# Patient Record
Sex: Male | Born: 1960
Health system: Southern US, Community
[De-identification: ages and names within clinical notes are randomized; demographics above are authoritative.]

## PROBLEM LIST (undated history)

## (undated) DIAGNOSIS — F419 Anxiety disorder, unspecified: Secondary | ICD-10-CM

## (undated) DIAGNOSIS — E291 Testicular hypofunction: Secondary | ICD-10-CM

## (undated) DIAGNOSIS — I10 Essential (primary) hypertension: Secondary | ICD-10-CM

## (undated) DIAGNOSIS — E785 Hyperlipidemia, unspecified: Secondary | ICD-10-CM

## (undated) HISTORY — DX: Testicular hypofunction: E29.1

## (undated) HISTORY — DX: Essential (primary) hypertension: I10

## (undated) HISTORY — DX: Hyperlipidemia, unspecified: E78.5

## (undated) HISTORY — DX: Anxiety disorder, unspecified: F41.9

---

## 2005-02-15 DIAGNOSIS — E291 Testicular hypofunction: Secondary | ICD-10-CM

## 2005-02-15 HISTORY — DX: Testicular hypofunction: E29.1

## 2013-07-11 LAB — BASIC METABOLIC PANEL
BUN: 19 mg/dL (ref 4–21)
CREATININE: 1.1 mg/dL (ref 0.6–1.3)
Glucose: 103 mg/dL
POTASSIUM: 4.5 mmol/L (ref 3.4–5.3)
Sodium: 141 mmol/L (ref 137–147)

## 2013-07-11 LAB — HEPATIC FUNCTION PANEL
ALK PHOS: 66 U/L (ref 25–125)
ALT: 50 U/L — AB (ref 10–40)
AST: 26 U/L (ref 14–40)
BILIRUBIN, TOTAL: 0.3 mg/dL

## 2013-07-11 LAB — LIPID PANEL
Cholesterol: 214 mg/dL — AB (ref 0–200)
HDL: 35 mg/dL (ref 35–70)
LDL Cholesterol: 130 mg/dL
TRIGLYCERIDES: 245 mg/dL — AB (ref 40–160)

## 2013-11-05 ENCOUNTER — Ambulatory Visit: Payer: Self-pay | Admitting: Internal Medicine

## 2013-11-19 ENCOUNTER — Ambulatory Visit: Payer: Self-pay | Admitting: Internal Medicine

## 2013-11-21 ENCOUNTER — Other Ambulatory Visit (INDEPENDENT_AMBULATORY_CARE_PROVIDER_SITE_OTHER): Payer: Commercial Managed Care - PPO

## 2013-11-21 ENCOUNTER — Encounter: Payer: Self-pay | Admitting: Internal Medicine

## 2013-11-21 ENCOUNTER — Ambulatory Visit (INDEPENDENT_AMBULATORY_CARE_PROVIDER_SITE_OTHER): Payer: Commercial Managed Care - PPO | Admitting: Internal Medicine

## 2013-11-21 VITALS — BP 122/84 | HR 68 | Temp 97.8°F | Resp 16 | Ht 71.0 in | Wt 202.0 lb

## 2013-11-21 DIAGNOSIS — F5105 Insomnia due to other mental disorder: Secondary | ICD-10-CM

## 2013-11-21 DIAGNOSIS — R5382 Chronic fatigue, unspecified: Secondary | ICD-10-CM

## 2013-11-21 DIAGNOSIS — Z0000 Encounter for general adult medical examination without abnormal findings: Secondary | ICD-10-CM | POA: Insufficient documentation

## 2013-11-21 DIAGNOSIS — R739 Hyperglycemia, unspecified: Secondary | ICD-10-CM | POA: Insufficient documentation

## 2013-11-21 DIAGNOSIS — F419 Anxiety disorder, unspecified: Secondary | ICD-10-CM

## 2013-11-21 DIAGNOSIS — E291 Testicular hypofunction: Secondary | ICD-10-CM

## 2013-11-21 LAB — FECAL OCCULT BLOOD, GUAIAC: Fecal Occult Blood: NEGATIVE

## 2013-11-21 LAB — LUTEINIZING HORMONE: LH: 5.55 m[IU]/mL (ref 1.50–9.30)

## 2013-11-21 LAB — CBC WITH DIFFERENTIAL/PLATELET
Basophils Absolute: 0 10*3/uL (ref 0.0–0.1)
Basophils Relative: 0.4 % (ref 0.0–3.0)
EOS ABS: 0.1 10*3/uL (ref 0.0–0.7)
Eosinophils Relative: 1.8 % (ref 0.0–5.0)
HEMATOCRIT: 45.1 % (ref 39.0–52.0)
HEMOGLOBIN: 15.4 g/dL (ref 13.0–17.0)
Lymphocytes Relative: 51 % — ABNORMAL HIGH (ref 12.0–46.0)
Lymphs Abs: 3.2 10*3/uL (ref 0.7–4.0)
MCHC: 34 g/dL (ref 30.0–36.0)
MCV: 94.9 fl (ref 78.0–100.0)
MONO ABS: 0.7 10*3/uL (ref 0.1–1.0)
Monocytes Relative: 11.1 % (ref 3.0–12.0)
NEUTROS ABS: 2.2 10*3/uL (ref 1.4–7.7)
Neutrophils Relative %: 35.7 % — ABNORMAL LOW (ref 43.0–77.0)
Platelets: 255 10*3/uL (ref 150.0–400.0)
RBC: 4.75 Mil/uL (ref 4.22–5.81)
RDW: 12.6 % (ref 11.5–15.5)
WBC: 6.3 10*3/uL (ref 4.0–10.5)

## 2013-11-21 LAB — COMPREHENSIVE METABOLIC PANEL
ALK PHOS: 64 U/L (ref 39–117)
ALT: 66 U/L — ABNORMAL HIGH (ref 0–53)
AST: 34 U/L (ref 0–37)
Albumin: 4.1 g/dL (ref 3.5–5.2)
BUN: 20 mg/dL (ref 6–23)
CALCIUM: 9.6 mg/dL (ref 8.4–10.5)
CO2: 26 mEq/L (ref 19–32)
Chloride: 105 mEq/L (ref 96–112)
Creatinine, Ser: 1 mg/dL (ref 0.4–1.5)
GFR: 81.47 mL/min (ref >60.00)
GLUCOSE: 97 mg/dL (ref 70–99)
Potassium: 4.7 mEq/L (ref 3.5–5.1)
Sodium: 138 mEq/L (ref 135–145)
Total Bilirubin: 0.7 mg/dL (ref 0.2–1.2)
Total Protein: 7.4 g/dL (ref 6.0–8.3)

## 2013-11-21 LAB — FOLLICLE STIMULATING HORMONE: FSH: 13.4 m[IU]/mL (ref 1.4–18.1)

## 2013-11-21 LAB — HEMOGLOBIN A1C: Hgb A1c MFr Bld: 5.5 % (ref 4.6–6.5)

## 2013-11-21 LAB — PSA: PSA: 1.29 ng/mL (ref 0.10–4.00)

## 2013-11-21 LAB — PROLACTIN: PROLACTIN: 6.1 ng/mL (ref 2.1–17.1)

## 2013-11-21 LAB — TSH: TSH: 1.46 u[IU]/mL (ref 0.35–4.50)

## 2013-11-21 MED ORDER — "NEEDLE (DISP) 18G X 1-1/2"" MISC": Status: DC

## 2013-11-21 MED ORDER — SUVOREXANT 15 MG PO TABS: 1.0000 | ORAL_TABLET | Freq: Every evening | ORAL | Status: DC | PRN

## 2013-11-21 MED ORDER — "SYRINGE/NEEDLE (DISP) 23G X 1"" 3 ML MISC": Status: DC

## 2013-11-21 MED ORDER — TESTOSTERONE CYPIONATE 200 MG/ML IM SOLN: 200.0000 mg | INTRAMUSCULAR | Status: DC

## 2013-11-21 NOTE — Assessment & Plan Note (Signed)
Will restart testosterone replacement therapy Will check his T level today as well as prolactin, FSH, LH, PSA, and TSH to look for secondary causes of hypogonadism

## 2013-11-21 NOTE — Assessment & Plan Note (Signed)
I will check labs to look for secondary of fatigue Will treat the insomnia and start TRT

## 2013-11-21 NOTE — Assessment & Plan Note (Signed)
Exam done Vaccines were updated and reviewed Labs ordered He was referred for a colonoscopy Pt ed material was given

## 2013-11-21 NOTE — Progress Notes (Signed)
Subjective:    Patient ID: Taylor Powell, male    DOB: Feb 21, 1960, 53 y.o.   MRN: [id number]  HPI Comments: New to me he complains of a 10 month history of worsening fatigue, low libido, weight gain, and ED. He also has chronic insomnia with DFA and FA, some nights he only sleeps for 2-3 hours.     Review of Systems  Constitutional: Positive for fatigue and unexpected weight change. Negative for fever, chills, diaphoresis, activity change and appetite change.  HENT: Negative.   Eyes: Negative.   Respiratory: Negative.  Negative for apnea, cough, choking, chest tightness, shortness of breath, wheezing and stridor.   Cardiovascular: Negative.  Negative for chest pain, palpitations and leg swelling.  Gastrointestinal: Negative.  Negative for nausea, vomiting, abdominal pain, diarrhea, constipation, blood in stool, abdominal distention, anal bleeding and rectal pain.  Endocrine: Negative.   Genitourinary: Negative.   Musculoskeletal: Positive for back pain (chronic, unchanged recently). Negative for arthralgias, gait problem, joint swelling, myalgias, neck pain and neck stiffness.  Skin: Negative.  Negative for rash.  Allergic/Immunologic: Negative.   Neurological: Negative.  Negative for dizziness, tremors, seizures, syncope, facial asymmetry, speech difficulty, weakness, light-headedness, numbness and headaches.  Hematological: Negative.  Negative for adenopathy. Does not bruise/bleed easily.  Psychiatric/Behavioral: Positive for sleep disturbance. Negative for suicidal ideas, hallucinations, behavioral problems, confusion, self-injury, dysphoric mood, decreased concentration and agitation. The patient is nervous/anxious. The patient is not hyperactive.        Objective:   Physical Exam  Vitals reviewed. Constitutional: He is oriented to person, place, and time. He appears well-developed and well-nourished. No distress.  HENT:  Head: Normocephalic and atraumatic.  Mouth/Throat:  Oropharynx is clear and moist. No oropharyngeal exudate.  Eyes: Conjunctivae are normal. Right eye exhibits no discharge. Left eye exhibits no discharge. No scleral icterus.  Neck: Normal range of motion. Neck supple. No JVD present. No tracheal deviation present. No thyromegaly present.  Cardiovascular: Normal rate, regular rhythm, normal heart sounds and intact distal pulses.  Exam reveals no gallop and no friction rub.   No murmur heard. Pulmonary/Chest: Effort normal and breath sounds normal. No stridor. No respiratory distress. He has no wheezes. He has no rales. He exhibits no tenderness.  Abdominal: Soft. Bowel sounds are normal. He exhibits no distension and no mass. There is no tenderness. There is no rebound and no guarding. Hernia confirmed negative in the right inguinal area and confirmed negative in the left inguinal area.  Genitourinary: Rectum normal, prostate normal, testes normal and penis normal. Rectal exam shows no external hemorrhoid, no internal hemorrhoid, no fissure, no mass, no tenderness and anal tone normal. Guaiac negative stool. Prostate is not enlarged and not tender. Right testis shows no mass, no swelling and no tenderness. Right testis is descended. Left testis shows no mass, no swelling and no tenderness. Left testis is descended. Circumcised. No penile erythema or penile tenderness. No discharge found.  Musculoskeletal: Normal range of motion. He exhibits no edema and no tenderness.  Lymphadenopathy:    He has no cervical adenopathy.       Right: No inguinal adenopathy present.       Left: No inguinal adenopathy present.  Neurological: He is oriented to person, place, and time.  Skin: Skin is warm and dry. No rash noted. He is not diaphoretic. No erythema. No pallor.  Psychiatric: He has a normal mood and affect. His behavior is normal. Judgment and thought content normal.     Lab Results  Component Value Date   CHOL 214* 07/11/2013   TRIG 245* 07/11/2013   HDL  35 07/11/2013   LDLCALC 130 07/11/2013   ALT 50* 07/11/2013   AST 26 07/11/2013   NA 141 07/11/2013   K 4.5 07/11/2013   CREATININE 1.1 07/11/2013   BUN 19 07/11/2013       Assessment & Plan:

## 2013-11-21 NOTE — Patient Instructions (Signed)
Testosterone This test is used to determine if your testosterone level is abnormal. This could be used to explain difficulty getting an erection (erectile dysfunction), inability of your partner to get pregnant (infertility), premature or delayed puberty if you are male, or the appearance of masculine physical features if you are male. PREPARATION FOR TEST A blood sample is obtained by inserting a needle into a vein in the arm. NORMAL FINDINGS  Free Testosterone: 0.3-2 pg/mL  % Free Testosterone: 0.1%-0.3% Total Testosterone:  7 mos-9 yrs (Tanner Stage I)  Male: Less than 30 ng/dL  Male: Less than 30 ng/dL  10-13 yrs (Tanner Stage II)  Male: Less than 300 ng/dL  Male: Less than 40 ng/dL  14-15 yrs (Tanner Stage III)  Male: 170-540 ng/dL  Male: Less than 60 ng/dL  16-19 yrs (Tanner Stage IV, V)  Male: 250-910 ng/dL  Male: Less than 70 ng/dL  20 yrs and over  Male: 280-1080 ng/dL  Male: Less than 70 ng/dL Ranges for normal findings may vary among different laboratories and hospitals. You should always check with your doctor after having lab work or other tests done to discuss the meaning of your test results and whether your values are considered within normal limits. MEANING OF TEST  Your caregiver will go over the test results with you and discuss the importance and meaning of your results, as well as treatment options and the need for additional tests if necessary. OBTAINING THE TEST RESULTS It is your responsibility to obtain your test results. Ask the lab or department performing the test when and how you will get your results. Document Released: 02/19/2004 Document Revised: 04/26/2011 Document Reviewed: 05/30/2013 [employer] Patient Information 2015 [employer]. This information is not intended to replace advice given to you by your health care provider. Make sure you discuss any questions you have with your health care provider.  

## 2013-11-21 NOTE — Assessment & Plan Note (Signed)
He will try belsomra for this 

## 2013-11-21 NOTE — Assessment & Plan Note (Signed)
Will check his A1C to see if he has developed DM2 

## 2013-11-22 LAB — TESTOSTERONE, FREE, TOTAL, SHBG
Sex Hormone Binding: 18 nmol/L (ref 13–71)
TESTOSTERONE: 201 ng/dL — AB (ref 300–890)
Testosterone, Free: 51.9 pg/mL (ref 47.0–244.0)
Testosterone-% Free: 2.6 % (ref 1.6–2.9)

## 2013-11-26 ENCOUNTER — Ambulatory Visit: Payer: Self-pay | Admitting: Internal Medicine

## 2013-11-30 ENCOUNTER — Telehealth: Payer: Self-pay | Admitting: Internal Medicine

## 2013-11-30 NOTE — Telephone Encounter (Signed)
Pt called and is requesting results from all lab results, he rec'd testosterone results but not the rest. He also rec'd a call from a Dr Fortunato CurlingJung office for appt. That office informed pt Dr Yetta BarreJones referred pt. Pt wants clarification why this appt was made as he was not aware.

## 2013-11-30 NOTE — Telephone Encounter (Signed)
Pt notified//lmovm 

## 2013-12-04 ENCOUNTER — Telehealth: Payer: Self-pay | Admitting: Internal Medicine

## 2013-12-04 DIAGNOSIS — Z0000 Encounter for general adult medical examination without abnormal findings: Secondary | ICD-10-CM

## 2013-12-04 NOTE — Telephone Encounter (Signed)
Patient left msg on my vm stating he did not get his questions answered regarding his lab work. CB# [phone]

## 2013-12-05 NOTE — Telephone Encounter (Signed)
done

## 2013-12-05 NOTE — Telephone Encounter (Signed)
Called spoke with patient who is upset over lab orders. He states that he requested labs for a lipid panel, HIV testing, and any other general labs for CPX that may have been left off. Pt would like those orders placed so that he may come back by when available. Thanks

## 2014-05-21 ENCOUNTER — Telehealth: Payer: Self-pay | Admitting: Internal Medicine

## 2014-05-21 NOTE — Telephone Encounter (Signed)
It has been six months since his last visit and this is a narcotic He will have to be seen

## 2014-05-21 NOTE — Telephone Encounter (Signed)
Pt called in and is need refill on testosterone cypionate (DEPOTESTOTERONE CYPIONATE) 200 MG/ML injection [[id number]][[id number]]  And also wants to know if he is suppose to come in for more blood work.  He stated he was suppose to get full blood work up on last visit   Best number [phone]

## 2014-05-22 NOTE — Telephone Encounter (Signed)
Pt back in and he is frustrated.  He is completely out of testosterone.  He made an appt for next wed the 13th but he needs enough testosterone to make til next week?  He would like a call back today    Best number [phone]. Ok to leave a message if he doesn't answer

## 2014-05-27 ENCOUNTER — Ambulatory Visit: Payer: Commercial Managed Care - PPO | Admitting: Internal Medicine

## 2014-05-29 ENCOUNTER — Other Ambulatory Visit (INDEPENDENT_AMBULATORY_CARE_PROVIDER_SITE_OTHER): Payer: Commercial Managed Care - PPO

## 2014-05-29 ENCOUNTER — Encounter: Payer: Self-pay | Admitting: Internal Medicine

## 2014-05-29 ENCOUNTER — Ambulatory Visit (INDEPENDENT_AMBULATORY_CARE_PROVIDER_SITE_OTHER): Payer: Commercial Managed Care - PPO | Admitting: Internal Medicine

## 2014-05-29 VITALS — BP 144/98 | HR 84 | Temp 98.7°F | Resp 16 | Ht 71.0 in | Wt 205.0 lb

## 2014-05-29 DIAGNOSIS — Z0000 Encounter for general adult medical examination without abnormal findings: Secondary | ICD-10-CM

## 2014-05-29 DIAGNOSIS — E291 Testicular hypofunction: Secondary | ICD-10-CM

## 2014-05-29 DIAGNOSIS — R7989 Other specified abnormal findings of blood chemistry: Secondary | ICD-10-CM | POA: Diagnosis not present

## 2014-05-29 DIAGNOSIS — I10 Essential (primary) hypertension: Secondary | ICD-10-CM

## 2014-05-29 LAB — CBC WITH DIFFERENTIAL/PLATELET
BASOS PCT: 0.3 % (ref 0.0–3.0)
Basophils Absolute: 0 10*3/uL (ref 0.0–0.1)
EOS PCT: 1.8 % (ref 0.0–5.0)
Eosinophils Absolute: 0.1 10*3/uL (ref 0.0–0.7)
HCT: 53 % — ABNORMAL HIGH (ref 39.0–52.0)
HEMOGLOBIN: 18 g/dL — AB (ref 13.0–17.0)
LYMPHS PCT: 37.3 % (ref 12.0–46.0)
Lymphs Abs: 2.7 10*3/uL (ref 0.7–4.0)
MCHC: 33.9 g/dL (ref 30.0–36.0)
MCV: 93.6 fl (ref 78.0–100.0)
Monocytes Absolute: 0.7 10*3/uL (ref 0.1–1.0)
Monocytes Relative: 9.5 % (ref 3.0–12.0)
Neutro Abs: 3.7 10*3/uL (ref 1.4–7.7)
Neutrophils Relative %: 51.1 % (ref 43.0–77.0)
Platelets: 253 10*3/uL (ref 150.0–400.0)
RBC: 5.67 Mil/uL (ref 4.22–5.81)
RDW: 13.6 % (ref 11.5–15.5)
WBC: 7.2 10*3/uL (ref 4.0–10.5)

## 2014-05-29 LAB — COMPREHENSIVE METABOLIC PANEL
ALBUMIN: 4.7 g/dL (ref 3.5–5.2)
ALT: 47 U/L (ref 0–53)
AST: 27 U/L (ref 0–37)
Alkaline Phosphatase: 58 U/L (ref 39–117)
BILIRUBIN TOTAL: 0.6 mg/dL (ref 0.2–1.2)
BUN: 18 mg/dL (ref 6–23)
CO2: 30 meq/L (ref 19–32)
Calcium: 10.3 mg/dL (ref 8.4–10.5)
Chloride: 100 mEq/L (ref 96–112)
Creatinine, Ser: 1.16 mg/dL (ref 0.40–1.50)
GFR: 70.09 mL/min (ref >60.00)
Glucose, Bld: 88 mg/dL (ref 70–99)
Potassium: 4.3 mEq/L (ref 3.5–5.1)
SODIUM: 138 meq/L (ref 135–145)
TOTAL PROTEIN: 7.6 g/dL (ref 6.0–8.3)

## 2014-05-29 LAB — URINALYSIS, ROUTINE W REFLEX MICROSCOPIC
Bilirubin Urine: NEGATIVE
Ketones, ur: NEGATIVE
Leukocytes, UA: NEGATIVE
NITRITE: NEGATIVE
Specific Gravity, Urine: 1.01 (ref 1.000–1.030)
Total Protein, Urine: NEGATIVE
URINE GLUCOSE: NEGATIVE
UROBILINOGEN UA: 0.2 (ref 0.0–1.0)
WBC UA: NONE SEEN (ref 0–?)
pH: 6 (ref 5.0–8.0)

## 2014-05-29 LAB — TSH: TSH: 2.45 u[IU]/mL (ref 0.35–4.50)

## 2014-05-29 LAB — LDL CHOLESTEROL, DIRECT: Direct LDL: 159 mg/dL

## 2014-05-29 LAB — LIPID PANEL
CHOLESTEROL: 231 mg/dL — AB (ref 0–200)
HDL: 41.3 mg/dL (ref >39.00)
NonHDL: 189.7
TRIGLYCERIDES: 241 mg/dL — AB (ref 0.0–149.0)
Total CHOL/HDL Ratio: 6
VLDL: 48.2 mg/dL — ABNORMAL HIGH (ref 0.0–40.0)

## 2014-05-29 MED ORDER — TESTOSTERONE CYPIONATE 200 MG/ML IM SOLN: 200.0000 mg | INTRAMUSCULAR | Status: DC

## 2014-05-29 MED ORDER — LOSARTAN POTASSIUM 100 MG PO TABS: 100.0000 mg | ORAL_TABLET | Freq: Every day | ORAL | Status: DC

## 2014-05-29 NOTE — Assessment & Plan Note (Signed)
Vaccines were reviewed Exam done Labs ordered Pt ed material was given 

## 2014-05-29 NOTE — Progress Notes (Signed)
Pre visit review using our clinic review tool, if applicable. No additional management support is needed unless otherwise documented below in the visit note. 

## 2014-05-29 NOTE — Assessment & Plan Note (Addendum)
He has new onset HTN EKG is WNL - no LVH I think an ARB is th best med for him Will check his labs today to screen for end organ damage and secondary causes of HTN

## 2014-05-29 NOTE — Progress Notes (Signed)
   Subjective:    Patient ID: Taylor Powell, male    DOB: 12-01-1960, 54 y.o.   MRN: [id number]  Hypertension This is a new problem. The current episode started today. The problem has been gradually worsening since onset. The problem is uncontrolled. Associated symptoms include anxiety. Pertinent negatives include no blurred vision, chest pain, headaches, malaise/fatigue, neck pain, orthopnea, palpitations, peripheral edema, PND, shortness of breath or sweats. Agents associated with hypertension include steroids. Past treatments include nothing. The current treatment provides no improvement. There are no compliance problems.       Review of Systems  Constitutional: Negative.  Negative for fever, chills, malaise/fatigue, diaphoresis, appetite change and fatigue.  HENT: Negative.   Eyes: Negative.  Negative for blurred vision.  Respiratory: Negative.  Negative for cough, choking, chest tightness, shortness of breath and stridor.   Cardiovascular: Negative.  Negative for chest pain, palpitations, orthopnea, leg swelling and PND.  Gastrointestinal: Negative.  Negative for nausea, vomiting, abdominal pain, diarrhea and constipation.  Endocrine: Negative.   Genitourinary: Negative.   Musculoskeletal: Negative.  Negative for back pain, joint swelling, arthralgias and neck pain.  Skin: Negative.  Negative for rash.  Allergic/Immunologic: Negative.   Neurological: Negative.  Negative for dizziness, tremors, seizures, speech difficulty, weakness, light-headedness and headaches.  Hematological: Negative.  Negative for adenopathy. Does not bruise/bleed easily.  Psychiatric/Behavioral: Negative.        Objective:   Physical Exam  Constitutional: He is oriented to person, place, and time. He appears well-developed and well-nourished. No distress.  HENT:  Head: Normocephalic and atraumatic.  Mouth/Throat: Oropharynx is clear and moist. No oropharyngeal exudate.  Eyes: Conjunctivae are normal. Right  eye exhibits no discharge. Left eye exhibits no discharge. No scleral icterus.  Neck: Normal range of motion. Neck supple. No JVD present. No tracheal deviation present. No thyromegaly present.  Cardiovascular: Normal rate, regular rhythm, normal heart sounds and intact distal pulses.  Exam reveals no gallop and no friction rub.   No murmur heard. Pulmonary/Chest: Effort normal and breath sounds normal. No stridor. No respiratory distress. He has no wheezes. He has no rales. He exhibits no tenderness.  Abdominal: Soft. Bowel sounds are normal. He exhibits no distension and no mass. There is no tenderness. There is no rebound and no guarding.  Musculoskeletal: Normal range of motion. He exhibits no edema or tenderness.  Lymphadenopathy:    He has no cervical adenopathy.  Neurological: He is oriented to person, place, and time.  Skin: Skin is warm and dry. No rash noted. He is not diaphoretic. No erythema. No pallor.  Psychiatric: He has a normal mood and affect. His behavior is normal. Judgment and thought content normal.  Vitals reviewed.    Lab Results  Component Value Date   WBC 6.3 11/21/2013   HGB 15.4 11/21/2013   HCT 45.1 11/21/2013   PLT 255.0 11/21/2013   GLUCOSE 97 11/21/2013   CHOL 214* 07/11/2013   TRIG 245* 07/11/2013   HDL 35 07/11/2013   LDLCALC 130 07/11/2013   ALT 66* 11/21/2013   AST 34 11/21/2013   NA 138 11/21/2013   K 4.7 11/21/2013   CL 105 11/21/2013   CREATININE 1.0 11/21/2013   BUN 20 11/21/2013   CO2 26 11/21/2013   TSH 1.46 11/21/2013   PSA 1.29 11/21/2013   HGBA1C 5.5 11/21/2013       Assessment & Plan:

## 2014-05-29 NOTE — Patient Instructions (Signed)
Health Maintenance A healthy lifestyle and preventative care can promote health and wellness.  Maintain regular health, dental, and eye exams.  Eat a healthy diet. Foods like vegetables, fruits, whole grains, low-fat dairy products, and lean protein foods contain the nutrients you need and are low in calories. Decrease your intake of foods high in solid fats, added sugars, and salt. Get information about a proper diet from your health care provider, if necessary.  Regular physical exercise is one of the most important things you can do for your health. Most adults should get at least 150 minutes of moderate-intensity exercise (any activity that increases your heart rate and causes you to sweat) each week. In addition, most adults need muscle-strengthening exercises on 2 or more days a week.   Maintain a healthy weight. The body mass index (BMI) is a screening tool to identify possible weight problems. It provides an estimate of body fat based on height and weight. Your health care provider can find your BMI and can help you achieve or maintain a healthy weight. For males 20 years and older:  A BMI below 18.5 is considered underweight.  A BMI of 18.5 to 24.9 is normal.  A BMI of 25 to 29.9 is considered overweight.  A BMI of 30 and above is considered obese.  Maintain normal blood lipids and cholesterol by exercising and minimizing your intake of saturated fat. Eat a balanced diet with plenty of fruits and vegetables. Blood tests for lipids and cholesterol should begin at age 20 and be repeated every 5 years. If your lipid or cholesterol levels are high, you are over age 50, or you are at high risk for heart disease, you may need your cholesterol levels checked more frequently.Ongoing high lipid and cholesterol levels should be treated with medicines if diet and exercise are not working.  If you smoke, find out from your health care provider how to quit. If you do not use tobacco, do not  start.  Lung cancer screening is recommended for adults aged 55-80 years who are at high risk for developing lung cancer because of a history of smoking. A yearly low-dose CT scan of the lungs is recommended for people who have at least a 30-pack-year history of smoking and are current smokers or have quit within the past 15 years. A pack year of smoking is smoking an average of 1 pack of cigarettes a day for 1 year (for example, a 30-pack-year history of smoking could mean smoking 1 pack a day for 30 years or 2 packs a day for 15 years). Yearly screening should continue until the smoker has stopped smoking for at least 15 years. Yearly screening should be stopped for people who develop a health problem that would prevent them from having lung cancer treatment.  If you choose to drink alcohol, do not have more than 2 drinks per day. One drink is considered to be 12 oz (360 mL) of beer, 5 oz (150 mL) of wine, or 1.5 oz (45 mL) of liquor.  Avoid the use of street drugs. Do not share needles with anyone. Ask for help if you need support or instructions about stopping the use of drugs.  High blood pressure causes heart disease and increases the risk of stroke. Blood pressure should be checked at least every 1-2 years. Ongoing high blood pressure should be treated with medicines if weight loss and exercise are not effective.  If you are 45-79 years old, ask your health care provider if   you should take aspirin to prevent heart disease.  Diabetes screening involves taking a blood sample to check your fasting blood sugar level. This should be done once every 3 years after age 45 if you are at a normal weight and without risk factors for diabetes. Testing should be considered at a younger age or be carried out more frequently if you are overweight and have at least 1 risk factor for diabetes.  Colorectal cancer can be detected and often prevented. Most routine colorectal cancer screening begins at the age of 50  and continues through age 75. However, your health care provider may recommend screening at an earlier age if you have risk factors for colon cancer. On a yearly basis, your health care provider may provide home test kits to check for hidden blood in the stool. A small camera at the end of a tube may be used to directly examine the colon (sigmoidoscopy or colonoscopy) to detect the earliest forms of colorectal cancer. Talk to your health care provider about this at age 50 when routine screening begins. A direct exam of the colon should be repeated every 5-10 years through age 75, unless early forms of precancerous polyps or small growths are found.  People who are at an increased risk for hepatitis B should be screened for this virus. You are considered at high risk for hepatitis B if:  You were born in a country where hepatitis B occurs often. Talk with your health care provider about which countries are considered high risk.  Your parents were born in a high-risk country and you have not received a shot to protect against hepatitis B (hepatitis B vaccine).  You have HIV or AIDS.  You use needles to inject street drugs.  You live with, or have sex with, someone who has hepatitis B.  You are a man who has sex with other men (MSM).  You get hemodialysis treatment.  You take certain medicines for conditions like cancer, organ transplantation, and autoimmune conditions.  Hepatitis C blood testing is recommended for all people born from 1945 through 1965 and any individual with known risk factors for hepatitis C.  Healthy men should no longer receive prostate-specific antigen (PSA) blood tests as part of routine cancer screening. Talk to your health care provider about prostate cancer screening.  Testicular cancer screening is not recommended for adolescents or adult males who have no symptoms. Screening includes self-exam, a health care provider exam, and other screening tests. Consult with your  health care provider about any symptoms you have or any concerns you have about testicular cancer.  Practice safe sex. Use condoms and avoid high-risk sexual practices to reduce the spread of sexually transmitted infections (STIs).  You should be screened for STIs, including gonorrhea and chlamydia if:  You are sexually active and are younger than 24 years.  You are older than 24 years, and your health care provider tells you that you are at risk for this type of infection.  Your sexual activity has changed since you were last screened, and you are at an increased risk for chlamydia or gonorrhea. Ask your health care provider if you are at risk.  If you are at risk of being infected with HIV, it is recommended that you take a prescription medicine daily to prevent HIV infection. This is called pre-exposure prophylaxis (PrEP). You are considered at risk if:  You are a man who has sex with other men (MSM).  You are a heterosexual man who   is sexually active with multiple partners.  You take drugs by injection.  You are sexually active with a partner who has HIV.  Talk with your health care provider about whether you are at high risk of being infected with HIV. If you choose to begin PrEP, you should first be tested for HIV. You should then be tested every 3 months for as long as you are taking PrEP.  Use sunscreen. Apply sunscreen liberally and repeatedly throughout the day. You should seek shade when your shadow is shorter than you. Protect yourself by wearing long sleeves, pants, a wide-brimmed hat, and sunglasses year round whenever you are outdoors.  Tell your health care provider of new moles or changes in moles, especially if there is a change in shape or color. Also, tell your health care provider if a mole is larger than the size of a pencil eraser.  A one-time screening for abdominal aortic aneurysm (AAA) and surgical repair of large AAAs by ultrasound is recommended for men aged  65-75 years who are current or former smokers.  Stay current with your vaccines (immunizations). Document Released: 07/31/2007 Document Revised: 02/06/2013 Document Reviewed: 06/29/2010 ExitCare Patient Information 2015 [employer]. This information is not intended to replace advice given to you by your health care provider. Make sure you discuss any questions you have with your health care provider.  Hypertension Hypertension, commonly called high blood pressure, is when the force of blood pumping through your arteries is too strong. Your arteries are the blood vessels that carry blood from your heart throughout your body. A blood pressure reading consists of a higher number over a lower number, such as 110/72. The higher number (systolic) is the pressure inside your arteries when your heart pumps. The lower number (diastolic) is the pressure inside your arteries when your heart relaxes. Ideally you want your blood pressure below 120/80. Hypertension forces your heart to work harder to pump blood. Your arteries may become narrow or stiff. Having hypertension puts you at risk for heart disease, stroke, and other problems.  RISK FACTORS Some risk factors for high blood pressure are controllable. Others are not.  Risk factors you cannot control include:   Race. You may be at higher risk if you are African American.  Age. Risk increases with age.  Gender. Men are at higher risk than women before age 45 years. After age 65, women are at higher risk than men. Risk factors you can control include:  Not getting enough exercise or physical activity.  Being overweight.  Getting too much fat, sugar, calories, or salt in your diet.  Drinking too much alcohol. SIGNS AND SYMPTOMS Hypertension does not usually cause signs or symptoms. Extremely high blood pressure (hypertensive crisis) may cause headache, anxiety, shortness of breath, and nosebleed. DIAGNOSIS  To check if you have hypertension,  your health care provider will measure your blood pressure while you are seated, with your arm held at the level of your heart. It should be measured at least twice using the same arm. Certain conditions can cause a difference in blood pressure between your right and left arms. A blood pressure reading that is higher than normal on one occasion does not mean that you need treatment. If one blood pressure reading is high, ask your health care provider about having it checked again. TREATMENT  Treating high blood pressure includes making lifestyle changes and possibly taking medicine. Living a healthy lifestyle can help lower high blood pressure. You may need to change   some of your habits. Lifestyle changes may include:  Following the DASH diet. This diet is high in fruits, vegetables, and whole grains. It is low in salt, red meat, and added sugars.  Getting at least 2 hours of brisk physical activity every week.  Losing weight if necessary.  Not smoking.  Limiting alcoholic beverages.  Learning ways to reduce stress. If lifestyle changes are not enough to get your blood pressure under control, your health care provider may prescribe medicine. You may need to take more than one. Work closely with your health care provider to understand the risks and benefits. HOME CARE INSTRUCTIONS  Have your blood pressure rechecked as directed by your health care provider.   Take medicines only as directed by your health care provider. Follow the directions carefully. Blood pressure medicines must be taken as prescribed. The medicine does not work as well when you skip doses. Skipping doses also puts you at risk for problems.   Do not smoke.   Monitor your blood pressure at home as directed by your health care provider. SEEK MEDICAL CARE IF:   You think you are having a reaction to medicines taken.  You have recurrent headaches or feel dizzy.  You have swelling in your ankles.  You have  trouble with your vision. SEEK IMMEDIATE MEDICAL CARE IF:  You develop a severe headache or confusion.  You have unusual weakness, numbness, or feel faint.  You have severe chest or abdominal pain.  You vomit repeatedly.  You have trouble breathing. MAKE SURE YOU:   Understand these instructions.  Will watch your condition.  Will get help right away if you are not doing well or get worse. Document Released: 02/01/2005 Document Revised: 06/18/2013 Document Reviewed: 11/24/2012 ExitCare Patient Information 2015 [employer]. This information is not intended to replace advice given to you by your health care provider. Make sure you discuss any questions you have with your health care provider.  

## 2014-05-29 NOTE — Telephone Encounter (Signed)
Pt called in and said that he was unhappy with they way this situation was handle and he stated that he never got a refill on his Testosterone and would talk to dr Yetta BarreJones today about this when he comes in.

## 2014-05-29 NOTE — Assessment & Plan Note (Signed)
He is doing well on the current T dose Will check his labs today to screen for metabolic complications

## 2014-05-30 ENCOUNTER — Encounter: Payer: Self-pay | Admitting: Internal Medicine

## 2014-05-30 LAB — HIV ANTIBODY (ROUTINE TESTING W REFLEX): HIV 1&2 Ab, 4th Generation: NONREACTIVE

## 2014-05-30 LAB — TESTOSTERONE, FREE, TOTAL, SHBG
Sex Hormone Binding: 17 nmol/L (ref 10–50)
TESTOSTERONE-% FREE: 2.8 % (ref 1.6–2.9)
Testosterone, Free: 95.8 pg/mL (ref 47.0–244.0)
Testosterone: 347 ng/dL (ref 300–890)

## 2014-06-05 ENCOUNTER — Telehealth: Payer: Self-pay | Admitting: Internal Medicine

## 2014-06-05 NOTE — Telephone Encounter (Signed)
Patient called needing his lab results

## 2014-06-05 NOTE — Telephone Encounter (Signed)
Patient notified//lmovm  

## 2014-08-26 ENCOUNTER — Ambulatory Visit: Payer: Commercial Managed Care - PPO | Admitting: Internal Medicine

## 2016-01-20 ENCOUNTER — Emergency Department (HOSPITAL_COMMUNITY): Payer: Commercial Managed Care - PPO

## 2016-01-20 ENCOUNTER — Encounter (HOSPITAL_COMMUNITY): Payer: Self-pay | Admitting: Emergency Medicine

## 2016-01-20 ENCOUNTER — Emergency Department (HOSPITAL_COMMUNITY)
Admission: EM | Admit: 2016-01-20 | Discharge: 2016-01-21 | Discharge status: Against Medical Advice | Payer: Commercial Managed Care - PPO | Attending: Emergency Medicine | Admitting: Emergency Medicine

## 2016-01-20 DIAGNOSIS — G459 Transient cerebral ischemic attack, unspecified: Secondary | ICD-10-CM | POA: Insufficient documentation

## 2016-01-20 DIAGNOSIS — I10 Essential (primary) hypertension: Secondary | ICD-10-CM | POA: Insufficient documentation

## 2016-01-20 DIAGNOSIS — R55 Syncope and collapse: Secondary | ICD-10-CM | POA: Diagnosis present

## 2016-01-20 DIAGNOSIS — Z5181 Encounter for therapeutic drug level monitoring: Secondary | ICD-10-CM | POA: Diagnosis not present

## 2016-01-20 LAB — CBC
HEMATOCRIT: 42.3 % (ref 39.0–52.0)
HEMOGLOBIN: 15.1 g/dL (ref 13.0–17.0)
MCH: 32.8 pg (ref 26.0–34.0)
MCHC: 35.7 g/dL (ref 30.0–36.0)
MCV: 92 fL (ref 78.0–100.0)
Platelets: 240 10*3/uL (ref 150–400)
RBC: 4.6 MIL/uL (ref 4.22–5.81)
RDW: 12.8 % (ref 11.5–15.5)
WBC: 5.8 10*3/uL (ref 4.0–10.5)

## 2016-01-20 LAB — BASIC METABOLIC PANEL
ANION GAP: 10 (ref 5–15)
BUN: 14 mg/dL (ref 6–20)
CALCIUM: 8.9 mg/dL (ref 8.9–10.3)
CO2: 22 mmol/L (ref 22–32)
Chloride: 108 mmol/L (ref 101–111)
Creatinine, Ser: 0.98 mg/dL (ref 0.61–1.24)
Glucose, Bld: 118 mg/dL — ABNORMAL HIGH (ref 65–99)
POTASSIUM: 3.1 mmol/L — AB (ref 3.5–5.1)
Sodium: 140 mmol/L (ref 135–145)

## 2016-01-20 LAB — I-STAT TROPONIN, ED: TROPONIN I, POC: 0.01 ng/mL (ref 0.00–0.08)

## 2016-01-20 NOTE — ED Notes (Signed)
Pt husband states he fell to his knees and said that his chest was hurting. LOC unknown. Pt was disoriented.    Pt recall laying down on his face.

## 2016-01-20 NOTE — ED Provider Notes (Signed)
[facility]-EMERGENCY DEPT Provider Note   CSN: [id number] Arrival date & time: 01/20/16  2106     History   Chief Complaint Chief Complaint  Patient presents with  . Loss of Consciousness  . Hypertension    HPI Taylor Powell is a 55 y.o. male.  The history is provided by the patient. No language interpreter was used.  Loss of Consciousness   This is a new problem. The current episode started 1 to 2 hours ago. The problem occurs constantly. The problem has been resolved. There was no loss of consciousness. The problem is associated with standing up. He has tried nothing for the symptoms. The treatment provided no relief. His past medical history does not include CAD, DM or vertigo.  Hypertension    Pt reports his husband was telling him about having a stressful day.  Pt report he began having spots, blindness in his left eye.  Pt reports he had numbness in his left arm.   Pt reports his leg felt weak and he fell to the ground. Husband went next door to get help. He does know if pt lost consciousness.   Pt is confused to events and is unsure if he hit his head.  He is unsure if he blacked out.  He denies any current symptoms. Past Medical History:  Diagnosis Date  . Anxiety   . Hyperlipidemia   . Hypertension   . Hypogonadism male 2007    Patient Active Problem List   Diagnosis Date Noted  . Essential hypertension 05/29/2014  . Routine adult health maintenance 11/21/2013  . Insomnia secondary to anxiety 11/21/2013  . Hypogonadism male 11/21/2013  . Hyperglycemia 11/21/2013    History reviewed. No pertinent surgical history.     Home Medications    Prior to Admission medications   Not on File    Family History Family History  Problem Relation Age of Onset  . Alcohol abuse Mother   . Depression Mother   . Diabetes Mother   . Stroke Father   . Depression Sister   . Arthritis Sister   . Asthma Neg Hx   . Cancer Neg Hx   . COPD Neg Hx   . Drug abuse Neg Hx   .  Early death Neg Hx   . Heart disease Neg Hx   . Hyperlipidemia Neg Hx   . Hypertension Neg Hx   . Learning disabilities Neg Hx   . Kidney disease Neg Hx     Social History Social History  Substance Use Topics  . Smoking status: Never Smoker  . Smokeless tobacco: Never Used  . Alcohol use 3.6 oz/week    3 Glasses of wine, 3 Shots of liquor per week     Allergies   Ambien [zolpidem tartrate] and Lunesta [eszopiclone]   Review of Systems Review of Systems  Cardiovascular: Positive for syncope.  All other systems reviewed and are negative.    Physical Exam Updated Vital Signs BP 135/87 (BP Location: Right Arm)   Pulse 84   Temp 98.7 F (37.1 C) (Oral)   Resp 19   Ht 5\' 11"  (1.803 m)   Wt 91.4 kg   SpO2 99%   BMI 28.10 kg/m   Physical Exam  Constitutional: He is oriented to person, place, and time. He appears well-developed and well-nourished.  HENT:  Head: Normocephalic and atraumatic.  Eyes: Pupils are equal, round, and reactive to light.  Neck: Normal range of motion. Neck supple.  Cardiovascular: Normal rate, regular rhythm,  normal heart sounds and intact distal pulses.   Pulmonary/Chest: Effort normal.  Abdominal: Soft.  Musculoskeletal: Normal range of motion.  Neurological: He is alert and oriented to person, place, and time. He displays normal reflexes. No cranial nerve deficit or sensory deficit. He exhibits normal muscle tone. Coordination normal.  Skin: Skin is warm.  Psychiatric: He has a normal mood and affect.  Nursing note and vitals reviewed.    ED Treatments / Results  Labs (all labs ordered are listed, but only abnormal results are displayed) Labs Reviewed  BASIC METABOLIC PANEL - Abnormal; Notable for the following:       Result Value   Potassium 3.1 (*)    Glucose, Bld 118 (*)    All other components within normal limits  CBC  I-STAT TROPOININ, ED    EKG  EKG Interpretation None       Radiology Dg Chest 2 View  Result  Date: 01/20/2016 CLINICAL DATA:  Chest pain.  Syncope and weakness. EXAM: CHEST  2 VIEW COMPARISON:  None. FINDINGS: Low lung volumes. The cardiomediastinal contours are normal. The lungs are clear. Pulmonary vasculature is normal. No consolidation, pleural effusion, or pneumothorax. No acute osseous abnormalities are seen. IMPRESSION: No acute abnormality. Electronically Signed   By: Rubye OaksMelanie  Ehinger M.D.   On: 01/20/2016 22:42   Ct Head Wo Contrast  Result Date: 01/21/2016 CLINICAL DATA:  Syncope.  Confusion. EXAM: CT HEAD WITHOUT CONTRAST TECHNIQUE: Contiguous axial images were obtained from the base of the skull through the vertex without intravenous contrast. COMPARISON:  None. FINDINGS: Brain: There is no intracranial hemorrhage, mass or evidence of acute infarction. There is no extra-axial fluid collection. Gray matter and white matter appear normal. Cerebral volume is normal for age. Brainstem and posterior fossa are unremarkable. The CSF spaces appear normal. Vascular: No hyperdense vessel or unexpected calcification. Skull: Normal. Negative for fracture or focal lesion. Sinuses/Orbits: No acute finding. Other: None. IMPRESSION: Normal brain Electronically Signed   By: Ellery Plunkaniel R Mitchell M.D.   On: 01/21/2016 00:06    Procedures Procedures (including critical care time)  Medications Ordered in ED Medications - No data to display   Initial Impression / Assessment and Plan / ED Course  I have reviewed the triage vital signs and the nursing notes.  Pertinent labs & imaging results that were available during my care of the patient were reviewed by me and considered in my medical decision making (see chart for details).  Clinical Course   Pt is reluctant to have a ct scan.   Pt's history is concerning for a TIA.  Pt agrees to ct scan.   Ct is normal.  Pt reports he is leaving.  Pt wanted to leave ama.  I spoke with him and advised I am concerned he may have had a tia.  I explained possibility  of cva.  I recommended admission with evaluation by neurologist and possible MRI.  Pt refuses.  He understands risk. He understands that I am concerned that he has not had a complete evaluation and that his symptoms are concerning for illness that could be debilitating and life threatening.  He does promise to return if any other symptoms are return of symptoms.  He agrees to see his primary MD.  I recommended  aspirin therapy.  Rn called me to pt's bedside.  Pt had syncopal episode after IV was removed.  He states blood causes him to pass out.  Pt attempted to to stand and had second episode.  I spoke with Dr. Otelia Limes Neurology who advised admit to medicine he will consult.  Pt needs further evaluation.  Pt refuses.   I have repeatedly asked him to stay.  RN has asked him to stay.  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 Pt is competent.  I can not force him to stay here.   RN will have pt sign out AMA.  I do not feel he should go home. Final Clinical Impressions(s) / ED Diagnoses   Final diagnoses:  Transient cerebral ischemia, unspecified type    New Prescriptions New Prescriptions   No medications on file     Elson Areas, PA-C 01/21/16 0108    Elson Areas, PA-C 01/21/16 0202    Canary Brim Tegeler, MD 01/21/16 [phone]

## 2016-01-20 NOTE — ED Triage Notes (Addendum)
Per [facility]    Pt coming from home after syncopal episode following an argument. Pt recall while being enroute of having spots/blindness in his L eye and  And feels as if his "funny bone was hit." All symptoms have resolved. Pt just feels dizzy now. Stroke scale negatyive with EMS.  HTN no history. 18G LAC.  EMS originally called out for CP and upon arrival husband states they were in an argument and syncoped on the kitchen. Pt was on the steps but doesn't remember how he got there. Confused and disoriented. Pt kept slouching continuously on the steps. Denies CP.

## 2016-01-21 LAB — RAPID URINE DRUG SCREEN, HOSP PERFORMED
Amphetamines: NOT DETECTED
BARBITURATES: NOT DETECTED
Benzodiazepines: NOT DETECTED
COCAINE: NOT DETECTED
Opiates: NOT DETECTED
TETRAHYDROCANNABINOL: NOT DETECTED

## 2016-01-21 NOTE — Discharge Instructions (Signed)
Take a baby aspirin every day.   See your Physician for recheck this week Return if any problems.

## 2016-01-21 NOTE — ED Notes (Addendum)
This RN went into the room to discharge the pt. Pt was standing in the room completely dressed. This RN went through his discharge After EMT removed IV from pt arm pt became light headed/diaphoretic at the sight of his blood. Pt sat himself down in the chair in the room. Pt began to fan himself. After about 1-2 min pt stood up. After standing up pt appeared to bend over and leaned his butt against the bed and passed out. The bed was no longer locked. The RN held her body wt against the bed in an attempt to keep the pt from falling. Pt Husband caught him and assisted him to the floor. Pt did not hit his head. This RN ran to get assistance. When arriving back into the room with the provider the pt was talking to the PA and stated that it was his blood that made him pass out. While talking to the staff the pt passed out against. Staff and the husband were able to catch the pt and get him onto the bed. At this time the pt was place back on the 5 lead and BP and Pulse ox.   Pt adamantly refusing to stay. Pt aware for the need to sign out AMA. This RN attempted on several occasions to convince the pt to stay. RN as well as the PA attempted to convince the pt to stay. Pt still refusing to stay.  Consulting civil engineerCharge RN ntoified

## 2016-02-02 ENCOUNTER — Encounter: Payer: Self-pay | Admitting: Family Medicine

## 2016-02-02 ENCOUNTER — Ambulatory Visit (INDEPENDENT_AMBULATORY_CARE_PROVIDER_SITE_OTHER): Payer: Commercial Managed Care - PPO | Admitting: Family Medicine

## 2016-02-02 VITALS — BP 144/100 | HR 77 | Ht 72.0 in | Wt 205.0 lb

## 2016-02-02 DIAGNOSIS — F439 Reaction to severe stress, unspecified: Secondary | ICD-10-CM

## 2016-02-02 DIAGNOSIS — I10 Essential (primary) hypertension: Secondary | ICD-10-CM

## 2016-02-02 DIAGNOSIS — E291 Testicular hypofunction: Secondary | ICD-10-CM

## 2016-02-02 DIAGNOSIS — M545 Low back pain: Secondary | ICD-10-CM | POA: Diagnosis not present

## 2016-02-02 DIAGNOSIS — M722 Plantar fascial fibromatosis: Secondary | ICD-10-CM | POA: Diagnosis not present

## 2016-02-02 MED ORDER — TESTOSTERONE 20.25 MG/ACT (1.62%) TD GEL: 2.0000 "application " | Freq: Every day | TRANSDERMAL | 5 refills | Status: DC

## 2016-02-02 NOTE — Progress Notes (Signed)
   Subjective:    Patient ID: Taylor Powell, male    DOB: [birth year], 55 y.o.   MRN: [id number]  HPI He is here as a new patient for an interval evaluation. He was seen in the emergency room and evaluated for possible TIA 2 weeks ago. He did not get a full workup due to him when he to leave. Since that time he has had no weakness, numbness, tingling, falls, headaches. Prior to his emergency room visit apparently he did fall and does complain of some right SI joint discomfort. He also has a previous history of hypogonadism but is not been on any medications for the last 14 months. During that time he has noted a decrease in his energy, stamina, libido and strength. He has also been under last stress due to work related issues as well as being involved in a organization that has required a lot of his energy and cause a lot of stress. Reside his position on that organization and does feel that about this. His relationship with his spouse has also been strained due to his increased work load. He also complains of bilateral heel discomfort that he has had for the last several years. The spinous him especially when he gets up in the mornings.   Review of Systems     Objective:   Physical Exam Alert and in no distress. Exam of his back does show some slight tenderness over the right upper SI joint. Full motion of the hips. Negative straight leg raising. Normal DTRs. Tender to palpation over the calcaneal spur. Full motion of the ankles.       Assessment & Plan:  Hypogonadism male - Plan: PSA, Testosterone, Testosterone (ANDROGEL PUMP) 20.25 MG/ACT (1.62%) GEL  Essential hypertension  Plantar fasciitis, bilateral  Low back pain, unspecified back pain laterality, unspecified chronicity, with sciatica presence unspecified  Stress Discussed start him back on testosterone. In the past he had been on shots and explained that I would like to try to have him on stable dose of the topical medication. We  will also follow-up on his blood pressure since it's elevated. If we can get his testosterone levels up, I think he'll be able to exercise and possibly lose some weight. Recommended stretching exercises for his heels. Also discussed stretching exercises for his back. He seems be doing well with distress and he and his spouse do seem to have good rapport. I will follow-up on that and schedule him for complete exam sometime this summer. Follow-up here roughly 1 month. Over 45 minutes, greater than 50% spent in counseling and coordination of care.

## 2016-02-03 LAB — PSA: PSA: 0.9 ng/mL (ref <=4.0)

## 2016-02-03 LAB — TESTOSTERONE: Testosterone: 229 ng/dL — ABNORMAL LOW (ref 250–827)

## 2016-02-23 ENCOUNTER — Telehealth: Payer: Self-pay | Admitting: Family Medicine

## 2016-02-23 NOTE — Telephone Encounter (Signed)
P.A. ANDROGEL approved, Called pharmacy & went thru but pt has high deductible of $4000 so cost is $236 a month.  I went online but unable to activate a co pay card.  I called & left pt a message

## 2016-03-04 ENCOUNTER — Ambulatory Visit: Payer: Commercial Managed Care - PPO | Admitting: Family Medicine

## 2016-03-19 ENCOUNTER — Ambulatory Visit: Payer: Commercial Managed Care - PPO | Admitting: Family Medicine

## 2016-04-02 ENCOUNTER — Encounter: Payer: Self-pay | Admitting: Family Medicine

## 2016-04-02 ENCOUNTER — Ambulatory Visit (INDEPENDENT_AMBULATORY_CARE_PROVIDER_SITE_OTHER): Payer: Commercial Managed Care - PPO | Admitting: Family Medicine

## 2016-04-02 VITALS — BP 130/88 | HR 80 | Ht 72.0 in | Wt 201.8 lb

## 2016-04-02 DIAGNOSIS — I10 Essential (primary) hypertension: Secondary | ICD-10-CM

## 2016-04-02 DIAGNOSIS — E291 Testicular hypofunction: Secondary | ICD-10-CM | POA: Diagnosis not present

## 2016-04-02 NOTE — Progress Notes (Signed)
   Subjective:    Patient ID: Taylor Powell, male    DOB: November 07, 1960, 56 y.o.   MRN: [id number]  HPI He is here for a recheck. He started AndroGel roughly 2 weeks ago. He has noted an improvement in his energy as well as libido. He is also starting back into a workout routine and has lost a few pounds. He has a goal of roughly 20 pound weight loss. Normally he would exercise at least 5 days per week.   Review of Systems     Objective:   Physical Exam Alert and in no distress otherwise not examined       Assessment & Plan:  Hypogonadism male - Plan: Testosterone  Essential hypertension Encouraged him to continue with his diet and exercise regimen. We will check a testosterone. His blood pressure is still slightly elevated and will recheck that again in 2 months.

## 2016-04-03 LAB — TESTOSTERONE: TESTOSTERONE: 193 ng/dL — AB (ref 250–827)

## 2016-04-05 ENCOUNTER — Other Ambulatory Visit: Payer: Self-pay

## 2016-04-05 DIAGNOSIS — E291 Testicular hypofunction: Secondary | ICD-10-CM

## 2016-04-05 MED ORDER — TESTOSTERONE 20.25 MG/ACT (1.62%) TD GEL: 4.0000 "application " | Freq: Every day | TRANSDERMAL | 5 refills | Status: DC

## 2016-06-04 ENCOUNTER — Ambulatory Visit: Payer: Commercial Managed Care - PPO | Admitting: Family Medicine

## 2016-06-09 ENCOUNTER — Encounter: Payer: Self-pay | Admitting: Family Medicine

## 2016-06-18 ENCOUNTER — Encounter: Payer: Self-pay | Admitting: Family Medicine

## 2016-06-18 ENCOUNTER — Ambulatory Visit (INDEPENDENT_AMBULATORY_CARE_PROVIDER_SITE_OTHER): Payer: Commercial Managed Care - PPO | Admitting: Family Medicine

## 2016-06-18 VITALS — BP 134/88 | HR 78 | Ht 72.0 in | Wt 204.0 lb

## 2016-06-18 DIAGNOSIS — I10 Essential (primary) hypertension: Secondary | ICD-10-CM

## 2016-06-18 DIAGNOSIS — E291 Testicular hypofunction: Secondary | ICD-10-CM | POA: Diagnosis not present

## 2016-06-18 NOTE — Progress Notes (Signed)
   Subjective:    Patient ID: Taylor IsaacsGordon Saggese, male    DOB: 1961/01/14, 56 y.o.   MRN: [id number]  HPI He is here for follow-up visit. He has been fairly consistent with taking his testosterone until the last couple of days when he missed yesterday and again today. His work schedule has him quite busy and he sometimes forgets. He also hasn't been able to maintain his weight but has not had any luck with weight loss.   Review of Systems     Objective:   Physical Exam Alert and in no distress otherwise not examined. Blood pressure is recorded.       Assessment & Plan:  Hypogonadism male - Plan: Testosterone  Essential hypertension  I discussed hypogonadism with him in regard to other options. In the past she had apparently been giving himself shots every week with his previous [place] in [place]. We might consider that again. Order written for testosterone for next week. Also discussed blood pressure and regard to diet, exercise and medications. He is again reluctant to go on medication. He will make an effort to make dietary and exercise changes and we will recheck that in 2 months.

## 2016-06-24 ENCOUNTER — Other Ambulatory Visit: Payer: Commercial Managed Care - PPO

## 2016-06-24 DIAGNOSIS — E291 Testicular hypofunction: Secondary | ICD-10-CM

## 2016-06-25 LAB — TESTOSTERONE: TESTOSTERONE: 501 ng/dL (ref 250–827)

## 2016-07-23 ENCOUNTER — Other Ambulatory Visit: Payer: Self-pay | Admitting: Family Medicine

## 2016-07-23 MED ORDER — METFORMIN HCL 500 MG PO TABS
500.0000 mg | ORAL_TABLET | Freq: Every day | ORAL | 1 refills | Status: DC
Start: 2016-07-23 — End: 2017-09-09

## 2016-07-23 NOTE — Progress Notes (Signed)
He had recent blood work which did show hemoglobin A1c of 5.7. He would like to be placed on metformin as well as continue with his diet and exercise.

## 2016-09-20 ENCOUNTER — Telehealth: Payer: Self-pay | Admitting: Family Medicine

## 2016-09-20 NOTE — Telephone Encounter (Signed)
Take care of this 

## 2016-09-20 NOTE — Telephone Encounter (Signed)
Pt called & states his ins has changed and now and he has a high deductible and testosterone gel will cost him $1000 a month and he can't afford that.  He wants to go back on the testosterone injections that he used to be on and he used to give himself, he thinks was 1/2 cc every Sunday.  Please advise pt

## 2016-09-21 ENCOUNTER — Other Ambulatory Visit: Payer: Self-pay | Admitting: Internal Medicine

## 2016-09-21 MED ORDER — TESTOSTERONE CYPIONATE 200 MG/ML IM SOLN: 200.0000 mg | INTRAMUSCULAR | 0 refills | Status: DC

## 2016-09-21 NOTE — Telephone Encounter (Signed)
Yes

## 2016-09-21 NOTE — Telephone Encounter (Signed)
Called pharmacist and checked on previous dosage and reordered from 4/16 RX.  Called pt and he states that he was originally on the 1ml q14 days but due to the fluctuation of the medication in his system that he was taking 1/2 ml q7 days & you were ok with that.  Is this ok?

## 2016-11-02 ENCOUNTER — Telehealth: Payer: Self-pay | Admitting: Family Medicine

## 2016-11-02 NOTE — Telephone Encounter (Signed)
Please call patient, he is concerned about bp readings They have been 157/104 pulse 88  150-160/104 for the last 3 days He is also having insomnia and chest cong

## 2016-11-03 ENCOUNTER — Telehealth: Payer: Self-pay | Admitting: Family Medicine

## 2016-11-03 NOTE — Telephone Encounter (Signed)
Pt ret your call.  Readings this morning at [place] 157/104 Pt ph [phone]

## 2017-01-03 ENCOUNTER — Other Ambulatory Visit: Payer: Self-pay

## 2017-01-03 MED ORDER — TESTOSTERONE CYPIONATE 200 MG/ML IM SOLN: 200.0000 mg | INTRAMUSCULAR | 0 refills | Status: DC

## 2017-01-03 NOTE — Progress Notes (Signed)
Called in testosterone per JCL. Trixie Rude/RLB

## 2017-03-07 DIAGNOSIS — H1089 Other conjunctivitis: Secondary | ICD-10-CM | POA: Diagnosis not present

## 2017-07-04 ENCOUNTER — Ambulatory Visit (INDEPENDENT_AMBULATORY_CARE_PROVIDER_SITE_OTHER): Payer: Commercial Managed Care - PPO | Admitting: Family Medicine

## 2017-07-04 ENCOUNTER — Encounter: Payer: Self-pay | Admitting: Family Medicine

## 2017-07-04 VITALS — BP 136/86 | HR 98 | Temp 97.4°F | Ht 70.5 in | Wt 197.0 lb

## 2017-07-04 DIAGNOSIS — Z1211 Encounter for screening for malignant neoplasm of colon: Secondary | ICD-10-CM | POA: Diagnosis not present

## 2017-07-04 DIAGNOSIS — E7439 Other disorders of intestinal carbohydrate absorption: Secondary | ICD-10-CM | POA: Diagnosis not present

## 2017-07-04 DIAGNOSIS — I10 Essential (primary) hypertension: Secondary | ICD-10-CM | POA: Diagnosis not present

## 2017-07-04 DIAGNOSIS — Z125 Encounter for screening for malignant neoplasm of prostate: Secondary | ICD-10-CM

## 2017-07-04 DIAGNOSIS — Z0000 Encounter for general adult medical examination without abnormal findings: Secondary | ICD-10-CM | POA: Diagnosis not present

## 2017-07-04 DIAGNOSIS — Z833 Family history of diabetes mellitus: Secondary | ICD-10-CM

## 2017-07-04 DIAGNOSIS — E291 Testicular hypofunction: Secondary | ICD-10-CM | POA: Diagnosis not present

## 2017-07-04 LAB — POCT URINALYSIS DIP (PROADVANTAGE DEVICE)
BILIRUBIN UA: NEGATIVE
GLUCOSE UA: NEGATIVE mg/dL
Ketones, POC UA: NEGATIVE mg/dL
LEUKOCYTES UA: NEGATIVE
NITRITE UA: NEGATIVE
PH UA: 6 (ref 5.0–8.0)
Protein Ur, POC: NEGATIVE mg/dL
RBC UA: NEGATIVE
Specific Gravity, Urine: 1.015
Urobilinogen, Ur: 3.5

## 2017-07-04 LAB — POCT GLYCOSYLATED HEMOGLOBIN (HGB A1C): Hemoglobin A1C: 5.7 % — AB (ref 4.0–5.6)

## 2017-07-04 NOTE — Patient Instructions (Addendum)
In terms of helping your blood pressure look into the DASH diet You can stop the aspirin.

## 2017-07-04 NOTE — Progress Notes (Addendum)
Subjective:    Patient ID: Taylor Powell, male    DOB: Aug 03, 1960, 57 y.o.   MRN: [id number]  HPI He is here for complete examination.  He has a previous history of hypogonadism but stopped taking testosterone in January.  Since then he is also made some diet and exercise changes and has lost several pounds.  He feels very good about this.  His stress levels have diminished over the last several months.  He does have a previous history of elevated blood pressure as well as glucose intolerance.  At one point he was on metformin but he is no longer taking that.  He does not take any blood pressure medications.  He does take aspirin.  He is also on dietary supplements.  He otherwise has no particular concerns or complaints. Family and social history as well as health maintenance and immunizations were reviewed.   Review of Systems  All other systems reviewed and are negative.      Objective:   Physical Exam BP 136/86 (BP Location: Left Arm, Patient Position: Sitting)   Pulse 98   Temp (!) 97.4 F (36.3 C)   Ht 5' 10.5" (1.791 m)   Wt 197 lb (89.4 kg)   SpO2 96%   BMI 27.87 kg/m   General Appearance:    Alert, cooperative, no distress, appears stated age  Head:    Normocephalic, without obvious abnormality, atraumatic  Eyes:    PERRL, conjunctiva/corneas clear, EOM's intact, fundi    benign  Ears:    Normal TM's and external ear canals  Nose:   Nares normal, mucosa normal, no drainage or sinus   tenderness  Throat:   Lips, mucosa, and tongue normal; teeth and gums normal  Neck:   Supple, no lymphadenopathy;  thyroid:  no   enlargement/tenderness/nodules; no carotid   bruit or JVD     Lungs:     Clear to auscultation bilaterally without wheezes, rales or     ronchi; respirations unlabored      Heart:    Regular rate and rhythm, S1 and S2 normal, no murmur, rub   or gallop     Abdomen:     Soft, non-tender, nondistended, normoactive bowel sounds,    no masses, no  hepatosplenomegaly  Genitalia:    Normal male external genitalia without lesions.  Testicles without masses.  No inguinal hernias.  Rectal:   Deferred  Extremities:   No clubbing, cyanosis or edema  Pulses:   2+ and symmetric all extremities  Skin:   Skin color, texture, turgor normal, no rashes or lesions  Lymph nodes:   Cervical, supraclavicular, and axillary nodes normal  Neurologic:   CNII-XII intact, normal strength, sensation and gait; reflexes 2+ and symmetric throughout          Psych:   Normal mood, affect, hygiene and grooming.   A1c is 5.7       Assessment & Plan:  Routine adult health maintenance - Plan: CBC with Differential/Platelet, Comprehensive metabolic panel, Lipid panel  Hypogonadism male - Plan: Testosterone  Essential hypertension - Plan: CBC with Differential/Platelet, Comprehensive metabolic panel  Glucose intolerance - Plan: Comprehensive metabolic panel  Family history of diabetes mellitus - Plan: Comprehensive metabolic panel  Screening for colon cancer - Plan: Cologuard  Screening for prostate cancer - Plan: PSA Discussed treating his blood pressure however he would like to hold off on that and recheck that in several months. I explained that his hemoglobin A1c is now just in  the category of being glucose intolerant.  Discussed this in relation to risk for diabetes.  There is a family history of diabetes.  I encouraged him to continue with his diet and exercise regimen. Recommend he stop taking the aspirin.  Recheck here in about 3 months and consider placing him on blood pressure medication at that time. Also discussed the DASH diet with him. 07/05/17 I discussed his blood work with him indicating that if he were diabetic I would put him on a statin.  He plans to make further changes in his diet.  Also discussed testosterone and at this point since he is not having any problems, no further intervention needed.  Also discussed his PSA

## 2017-07-04 NOTE — Addendum Note (Signed)
Addended by: Renelda Loma on: 07/04/2017 04:22 PM   Modules accepted: Orders

## 2017-07-05 LAB — CBC WITH DIFFERENTIAL/PLATELET
BASOS: 0 %
Basophils Absolute: 0 10*3/uL (ref 0.0–0.2)
EOS (ABSOLUTE): 0.1 10*3/uL (ref 0.0–0.4)
EOS: 1 %
HEMATOCRIT: 47.3 % (ref 37.5–51.0)
Hemoglobin: 16.4 g/dL (ref 13.0–17.7)
Immature Grans (Abs): 0 10*3/uL (ref 0.0–0.1)
Immature Granulocytes: 0 %
LYMPHS ABS: 3.1 10*3/uL (ref 0.7–3.1)
Lymphs: 44 %
MCH: 32 pg (ref 26.6–33.0)
MCHC: 34.7 g/dL (ref 31.5–35.7)
MCV: 92 fL (ref 79–97)
Monocytes Absolute: 0.6 10*3/uL (ref 0.1–0.9)
Monocytes: 8 %
Neutrophils Absolute: 3.2 10*3/uL (ref 1.4–7.0)
Neutrophils: 47 %
Platelets: 254 10*3/uL (ref 150–450)
RBC: 5.12 x10E6/uL (ref 4.14–5.80)
RDW: 12.8 % (ref 12.3–15.4)
WBC: 6.9 10*3/uL (ref 3.4–10.8)

## 2017-07-05 LAB — LIPID PANEL
Chol/HDL Ratio: 4.9 ratio (ref 0.0–5.0)
Cholesterol, Total: 247 mg/dL — ABNORMAL HIGH (ref 100–199)
HDL: 50 mg/dL (ref >39)
LDL Calculated: 141 mg/dL — ABNORMAL HIGH (ref 0–99)
TRIGLYCERIDES: 281 mg/dL — AB (ref 0–149)
VLDL Cholesterol Cal: 56 mg/dL — ABNORMAL HIGH (ref 5–40)

## 2017-07-05 LAB — COMPREHENSIVE METABOLIC PANEL
ALT: 72 IU/L — ABNORMAL HIGH (ref 0–44)
AST: 30 IU/L (ref 0–40)
Albumin/Globulin Ratio: 2.1 (ref 1.2–2.2)
Albumin: 4.8 g/dL (ref 3.5–5.5)
Alkaline Phosphatase: 62 IU/L (ref 39–117)
BUN/Creatinine Ratio: 14 (ref 9–20)
BUN: 14 mg/dL (ref 6–24)
Bilirubin Total: 0.5 mg/dL (ref 0.0–1.2)
CO2: 23 mmol/L (ref 20–29)
Calcium: 9.6 mg/dL (ref 8.7–10.2)
Chloride: 100 mmol/L (ref 96–106)
Creatinine, Ser: 1.01 mg/dL (ref 0.76–1.27)
GFR calc Af Amer: 96 mL/min/{1.73_m2} (ref >59)
GFR calc non Af Amer: 83 mL/min/{1.73_m2} (ref >59)
Globulin, Total: 2.3 g/dL (ref 1.5–4.5)
Glucose: 91 mg/dL (ref 65–99)
Potassium: 4.1 mmol/L (ref 3.5–5.2)
Sodium: 140 mmol/L (ref 134–144)
Total Protein: 7.1 g/dL (ref 6.0–8.5)

## 2017-07-05 LAB — PSA: PROSTATE SPECIFIC AG, SERUM: 1.4 ng/mL (ref 0.0–4.0)

## 2017-07-05 LAB — TESTOSTERONE: Testosterone: 190 ng/dL — ABNORMAL LOW (ref 264–916)

## 2017-07-19 DIAGNOSIS — Z1211 Encounter for screening for malignant neoplasm of colon: Secondary | ICD-10-CM | POA: Diagnosis not present

## 2017-07-20 LAB — COLOGUARD: Cologuard: NEGATIVE

## 2017-07-29 ENCOUNTER — Encounter: Payer: Self-pay | Admitting: Family Medicine

## 2017-08-01 ENCOUNTER — Telehealth: Payer: Self-pay | Admitting: Family Medicine

## 2017-08-01 NOTE — Telephone Encounter (Signed)
I do not think that we have seen the Cologuard test on him yet

## 2017-08-01 NOTE — Telephone Encounter (Signed)
Left message for pt to call me back  Cologuard test were negative. Abstracted and will be scanned in system

## 2017-08-01 NOTE — Telephone Encounter (Signed)
Pt called to check on colonguard results. Please advise pt at [phone].

## 2017-08-02 NOTE — Telephone Encounter (Signed)
Patient was given his cologuard results.

## 2017-09-09 ENCOUNTER — Ambulatory Visit (INDEPENDENT_AMBULATORY_CARE_PROVIDER_SITE_OTHER): Payer: Commercial Managed Care - PPO | Admitting: Medical

## 2017-09-09 VITALS — BP 130/92 | HR 106 | Temp 99.5°F | Resp 16 | Ht 71.0 in | Wt 199.4 lb

## 2017-09-09 DIAGNOSIS — J988 Other specified respiratory disorders: Secondary | ICD-10-CM

## 2017-09-09 DIAGNOSIS — R52 Pain, unspecified: Secondary | ICD-10-CM | POA: Diagnosis not present

## 2017-09-09 DIAGNOSIS — R05 Cough: Secondary | ICD-10-CM | POA: Diagnosis not present

## 2017-09-09 DIAGNOSIS — R6883 Chills (without fever): Secondary | ICD-10-CM

## 2017-09-09 DIAGNOSIS — R059 Cough, unspecified: Secondary | ICD-10-CM

## 2017-09-09 LAB — POCT INFLUENZA A/B
Influenza A, POC: NEGATIVE
Influenza B, POC: NEGATIVE

## 2017-09-09 MED ORDER — BENZONATATE 200 MG PO CAPS: 200.0000 mg | ORAL_CAPSULE | Freq: Three times a day (TID) | ORAL | 0 refills | Status: DC | PRN

## 2017-09-09 MED ORDER — AZITHROMYCIN 250 MG PO TABS: ORAL_TABLET | ORAL | 0 refills | Status: DC

## 2017-09-09 NOTE — Progress Notes (Signed)
Subjective; Chief Complaint  Patient presents with  . flu ?    fever, body aches, cough, congestion, fatigue X 3days   5 days ago started feeling dizzy, fatigue, and hasn't had much sleep this week been out of town on business.  Has had cough, congestion, fever, body aches.  Hears rustling sound in throat and lungs at night during sleep.   Has had major headaches this week as well.   Coughing makes head feel likes its going to explode.   Had 101 temp yesterday and Tuesday, had cold chills, sore in chest from cough.   Has joint pain.   Used some mucinex, alka selter cold pulse.   No other aggravating or relieving factors. No other complaint.  Past Medical History:  Diagnosis Date  . Anxiety   . Hyperlipidemia   . Hypertension   . Hypogonadism male 2007   Current Outpatient Medications on File Prior to Visit  Medication Sig Dispense Refill  . aspirin 81 MG chewable tablet Chew 81 mg by mouth daily.    . cholecalciferol (VITAMIN D) 1000 units tablet Take 1,000 Units by mouth daily.    Marland Kitchen. ibuprofen (ADVIL,MOTRIN) 200 MG tablet Take 200 mg by mouth every 6 (six) hours as needed.    . NONFORMULARY OR COMPOUNDED ITEM pygeum and plant sterols 62.5 mg BID    . Omega-3 Fatty Acids (FISH OIL) 1000 MG CAPS Take by mouth.     No current facility-administered medications on file prior to visit.    ROS as in subjective    Objective: BP (!) 130/92   Pulse (!) 106   Temp 99.5 F (37.5 C) (Oral)   Resp 16   Ht 5\' 11"  (1.803 m)   Wt 199 lb 6.4 oz (90.4 kg)   SpO2 97%   BMI 27.81 kg/m    BP Readings from Last 3 Encounters:  09/09/17 (!) 130/92  07/04/17 136/86  06/18/16 134/88   General appearance: Alert, WD/WN, no distress, ill appearing                             Skin: warm, no rash, no diaphoresis                           Head: no sinus tenderness                            Eyes: conjunctiva normal, corneas clear, PERRLA                            Ears:flat TMs, external ear canals  normal                          Nose: septum midline, turbinates swollen, with erythema and clear discharge             Mouth/throat: MMM, tongue normal, mild pharyngeal erythema                           Neck: supple, no adenopathy, no thyromegaly, non tender                          Heart: RRR, normal S1, S2, no murmurs  Lungs: +bronchial breath sounds, +scattered rhonchi, no wheezes, no rales                Extremities: no edema, non tender       Assessment: Encounter Diagnoses  Name Primary?  . Cough Yes  . Body aches   . Chills   . Respiratory tract infection       Plan:  Flu test negative  Recommended chest xray, but he declines.  Clinically he may have pneumonia.   Begin medications below,rest, hydrate well, and if not seeing improvement in the next 48 hours, call or return.  Taylor Powell was seen today for flu ?.  Diagnoses and all orders for this visit:  Cough -     Influenza A/B  Body aches -     Influenza A/B  Chills -     Influenza A/B  Respiratory tract infection -     Influenza A/B  Other orders -     azithromycin (ZITHROMAX) 250 MG tablet; 2 tablets day 1, then 1 tablet days 2-4 -     benzonatate (TESSALON) 200 MG capsule; Take 1 capsule (200 mg total) by mouth 3 (three) times daily as needed for cough.

## 2017-09-26 ENCOUNTER — Other Ambulatory Visit: Payer: Self-pay | Admitting: Medical

## 2017-09-26 ENCOUNTER — Telehealth: Payer: Self-pay | Admitting: Medical

## 2017-09-26 MED ORDER — LEVOFLOXACIN 500 MG PO TABS: 500.0000 mg | ORAL_TABLET | Freq: Every day | ORAL | 0 refills | Status: DC

## 2017-09-26 NOTE — Telephone Encounter (Signed)
Pt states he got better after antibiotic but now he is out of town and symptoms are coming back, coughing and can't stop. Coughing up sputum, sometimes brown/yellow.  Still has some Benzonatate.  Would like refill on the azithromycin or a different antibiotic if you think something else would be better, he is afraid of getting pneumonia.  Would like it called into [facility] [address] [place][phone]

## 2017-09-26 NOTE — Telephone Encounter (Signed)
Different antibiotic sent, Levaquin.    He should be rechecked when back in town, needs chest xray.  (make him f/u with me or his PCP Dr. Susann GivensLalonde)

## 2017-09-26 NOTE — Telephone Encounter (Signed)
Called left message for pt

## 2017-09-27 ENCOUNTER — Telehealth: Payer: Self-pay | Admitting: Internal Medicine

## 2017-09-27 ENCOUNTER — Other Ambulatory Visit: Payer: Self-pay | Admitting: Medical

## 2017-09-27 MED ORDER — AZITHROMYCIN 250 MG PO TABS: ORAL_TABLET | ORAL | 0 refills | Status: DC

## 2017-09-27 NOTE — Telephone Encounter (Signed)
Left detailed message on pt's vm

## 2017-09-27 NOTE — Telephone Encounter (Signed)
zpak sent

## 2017-09-27 NOTE — Telephone Encounter (Signed)
Pt called stating that he can not take levaquin due to joint aches and said something yesterday about it on the phone. He wants to know can you just send in a zapk until he can get back in town

## 2017-12-07 ENCOUNTER — Telehealth: Payer: Self-pay

## 2017-12-07 ENCOUNTER — Telehealth: Payer: Self-pay | Admitting: Family Medicine

## 2017-12-07 ENCOUNTER — Other Ambulatory Visit: Payer: Self-pay

## 2017-12-07 DIAGNOSIS — R05 Cough: Secondary | ICD-10-CM

## 2017-12-07 DIAGNOSIS — R059 Cough, unspecified: Secondary | ICD-10-CM

## 2017-12-07 NOTE — Telephone Encounter (Signed)
Send him for the x-ray and then have him follow-up with me

## 2017-12-07 NOTE — Telephone Encounter (Signed)
Called pt back to advise he may go have his x-ray done and Dr. Susann Givens will look at his results and advise me as to what you should do. KH

## 2017-12-07 NOTE — Telephone Encounter (Signed)
Pt has been seen and has had medication but he continues to have issues with this lingering cough. He was informed by [place] that he may possibly need to go for an xray.   Pt states cough gets better and then comes back at this point he is coughing up green and yellow phlegm. Pt was offered and appt but he wanted to speak to JCL to see if he should go for an xray prior. Please advise pt at [phone].

## 2017-12-08 ENCOUNTER — Ambulatory Visit
Admission: RE | Admit: 2017-12-08 | Discharge: 2017-12-08 | Disposition: A | Discharge status: Home / Self Care | Payer: Commercial Managed Care - PPO | Source: Ambulatory Visit | Attending: Family Medicine | Admitting: Family Medicine

## 2017-12-08 DIAGNOSIS — R05 Cough: Secondary | ICD-10-CM

## 2017-12-08 DIAGNOSIS — R059 Cough, unspecified: Secondary | ICD-10-CM

## 2017-12-09 MED ORDER — AMOXICILLIN-POT CLAVULANATE 875-125 MG PO TABS: 1.0000 | ORAL_TABLET | Freq: Two times a day (BID) | ORAL | 0 refills | Status: DC

## 2017-12-09 MED ORDER — PREDNISONE 10 MG (48) PO TBPK: ORAL_TABLET | ORAL | 0 refills | Status: DC

## 2017-12-09 NOTE — Telephone Encounter (Signed)
Pt called back and xray results is given. He did not make an appt. He states this is really frustrating and effecting his live. He can't work effectively because during meetings he has to cough and fly. He states he coughs when he laughs and climbs stairs. He is wondering if he should try an inhaler. He states that he really can't keep coming in due to missing work so much. Pt would like to speak to you. Pt can be reached at [phone] and uses [facility] on [address].

## 2017-12-09 NOTE — Telephone Encounter (Signed)
He has been treated with azithromycin, Levaquin and most recently Keflex by his physician in [place].  He continues to have a productive cough.  He also has seasonal allergies and is been intermittently using Zyrtec.  I will call in Augmentin which she says has worked well in the past.  We will also give [facility], he is to use Prilosec 40 mg daily as well as Zyrtec and Flonase nasal spray.  If he is still having difficulty, I will refer to pulmonary.

## 2018-02-11 IMAGING — CT CT HEAD W/O CM
4 series · 22 of 47 positions shown, 26 images · non-contrast
Comparison: None.

CLINICAL DATA: Syncope.  Confusion.

EXAM:
CT HEAD WITHOUT CONTRAST
TECHNIQUE: Contiguous axial images were obtained from the base of the skull
through the vertex without intravenous contrast.

[Series 201: head w/o, idose (1) · axial · non-contrast · 0.57mm/px · z∈[+876,+1009]mm · 13 of 34 slices shown, 17 images]
[im 3/34  brain]
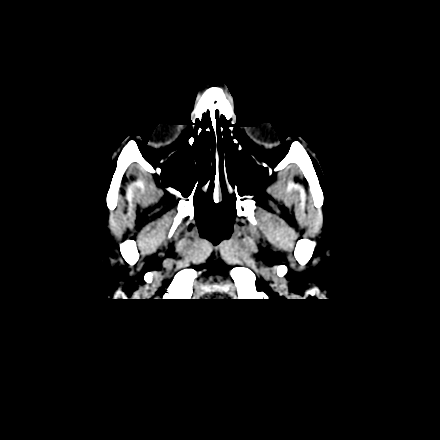
[im 3/34  bone]
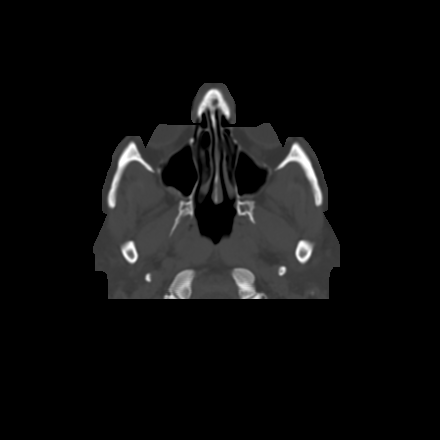
[im 5/34  brain]
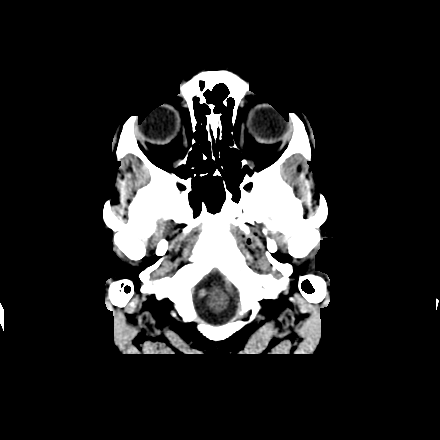
[im 8/34  brain]
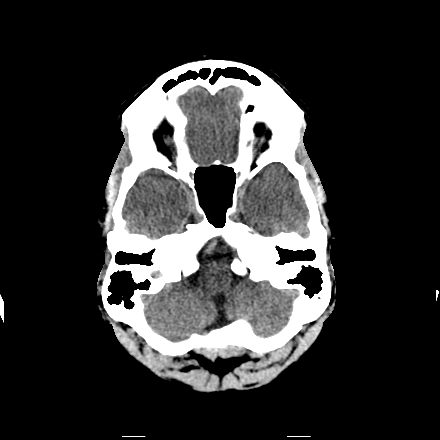
[im 10/34  brain]
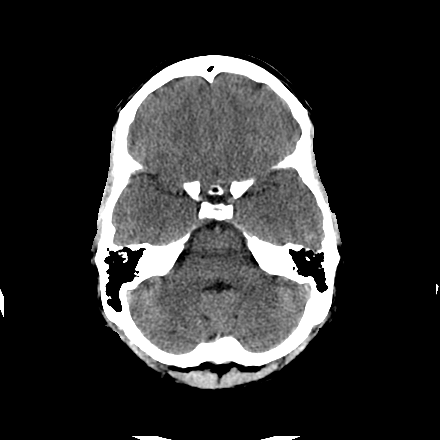
[im 12/34  brain]
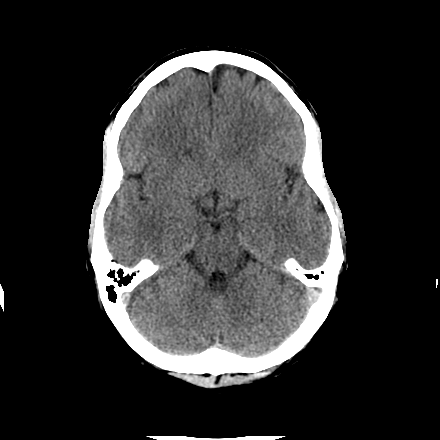
[im 12/34  bone]
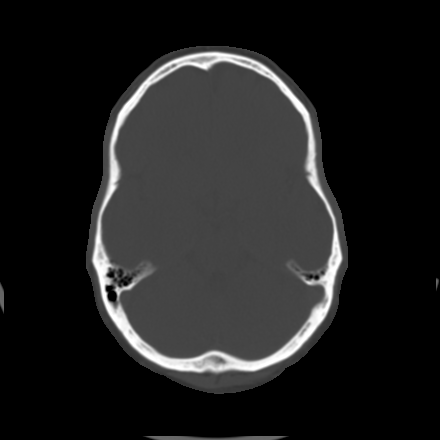
[im 15/34  brain]
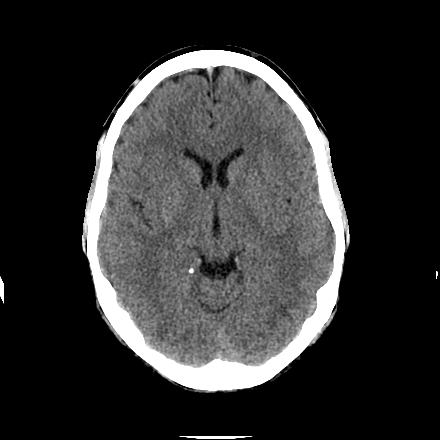
[im 17/34  brain]
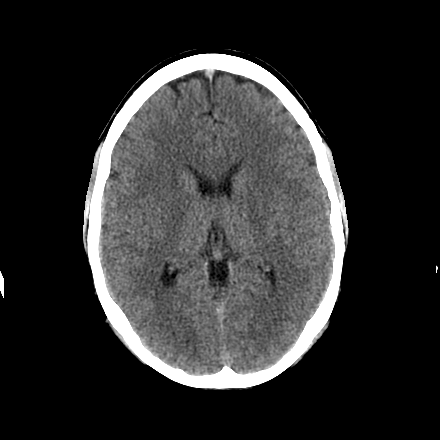
[im 19/34  brain]
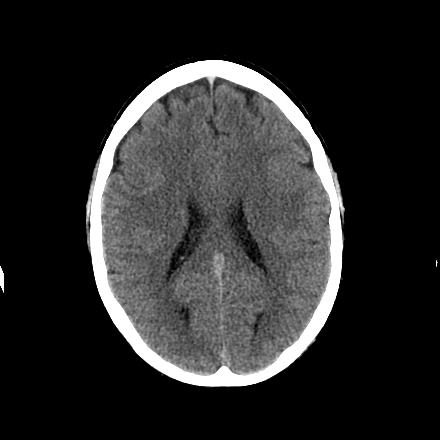
[im 22/34  brain]
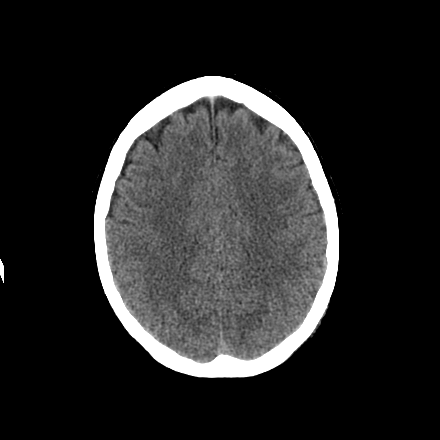
[im 22/34  bone]
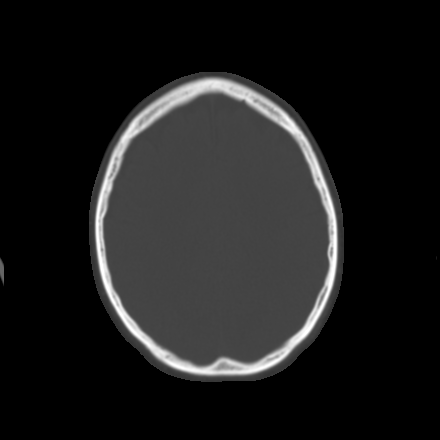
[im 24/34  brain]
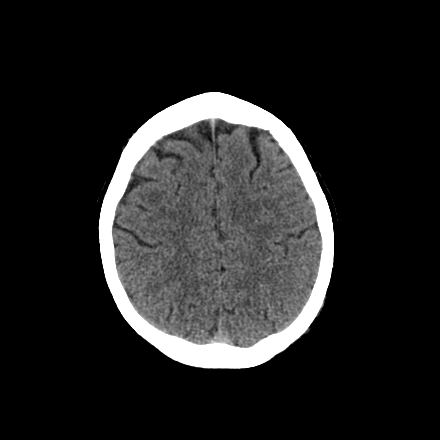
[im 26/34  brain]
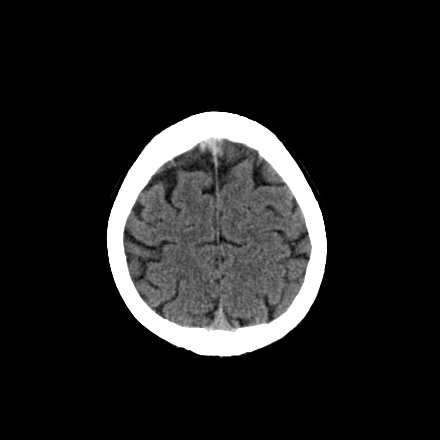
[im 29/34  brain]
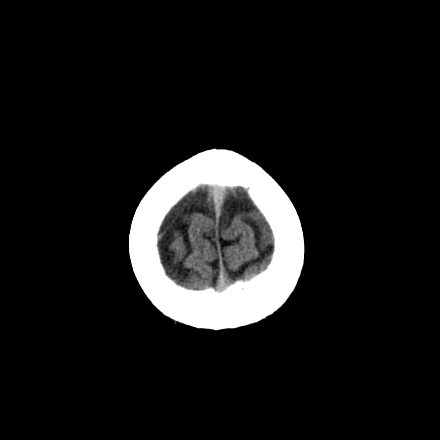
[im 31/34  brain]
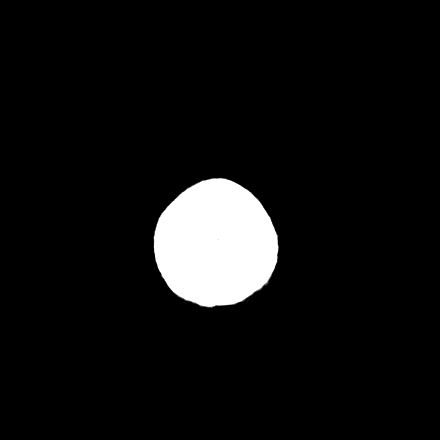
[im 31/34  bone]
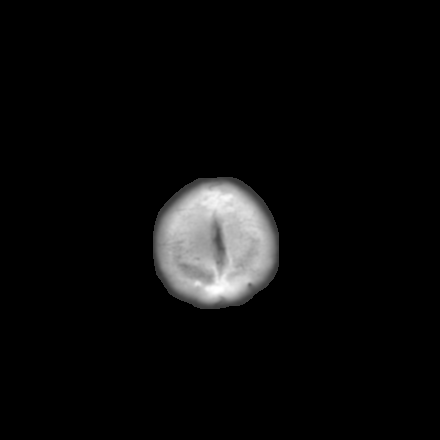

[Series 202: head w/o bone, idose (1) · axial · non-contrast · 0.57mm/px · z∈[+876,+919]mm · 3 of 34 slices shown]
[im 3/34  bone]
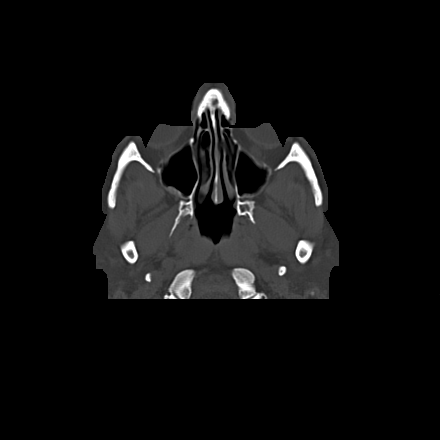
[im 8/34  bone]
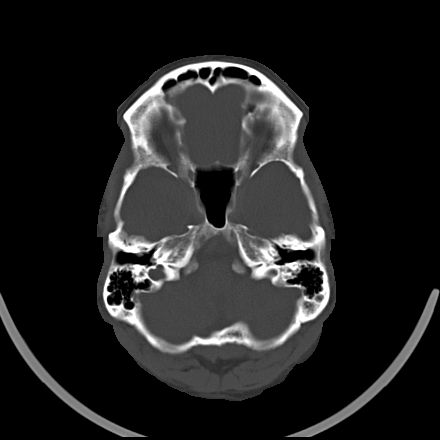
[im 12/34  bone]
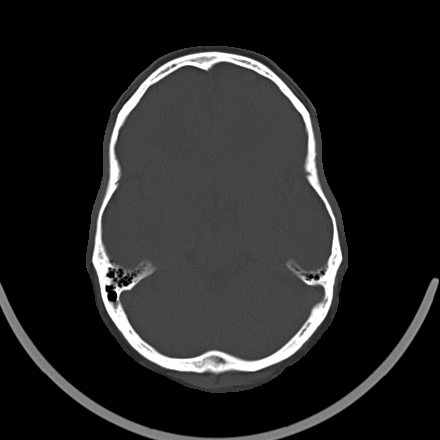

[Series 203: coronal st, idose (1) · coronal · 0.42mm/px · 3 of 76 slices shown]
[im 26/76  brain]
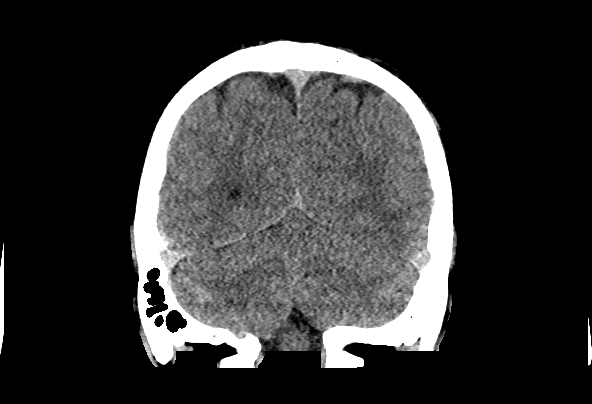
[im 34/76  brain]
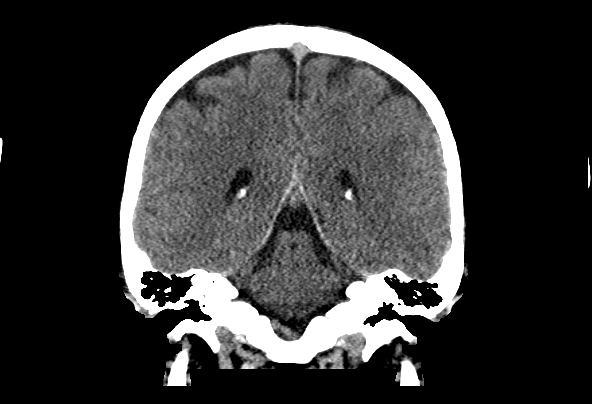
[im 42/76  brain]
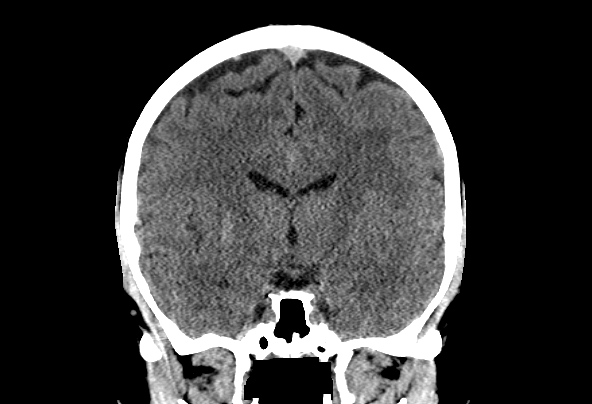

[Series 204: sagittal st, idose (1) · sagittal · 0.42mm/px · 3 of 83 slices shown]
[im 28/83  brain]
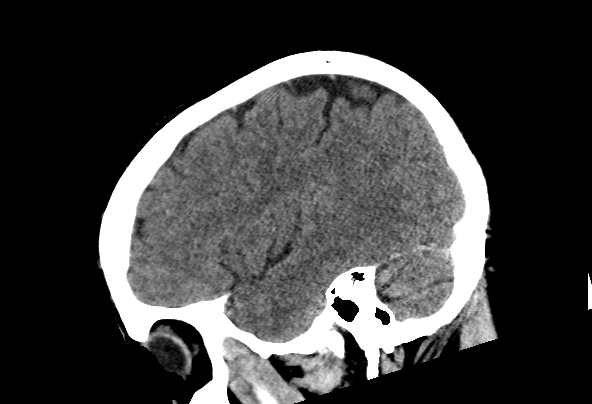
[im 42/83  brain]
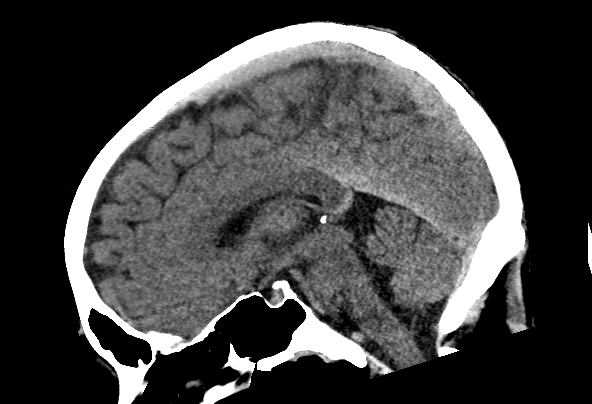
[im 55/83  brain]
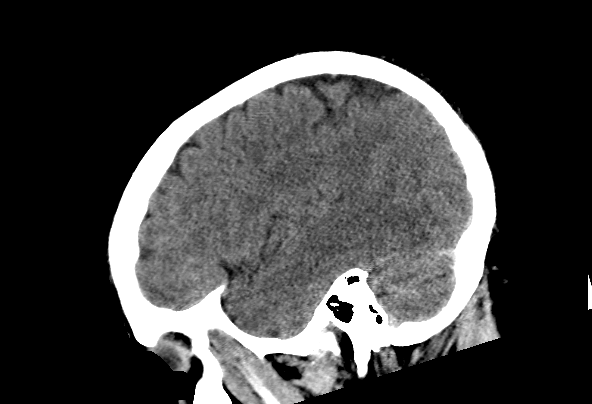

[22 of 47 positions shown; findings below may reference images not displayed]

FINDINGS: Brain:

There is no intracranial hemorrhage, mass or evidence of acute
infarction. There is no extra-axial fluid collection. Gray matter
and white matter appear normal. Cerebral volume is normal for age.
Brainstem and posterior fossa are unremarkable. The CSF spaces
appear normal.

Vascular: No hyperdense vessel or unexpected calcification.

Skull: Normal. Negative for fracture or focal lesion.

Sinuses/Orbits: No acute finding.

Other: None.
IMPRESSION: Normal brain

## 2020-10-24 ENCOUNTER — Ambulatory Visit (INDEPENDENT_AMBULATORY_CARE_PROVIDER_SITE_OTHER): Payer: Commercial Managed Care - PPO

## 2020-10-24 ENCOUNTER — Other Ambulatory Visit: Payer: Self-pay

## 2020-10-24 DIAGNOSIS — Z23 Encounter for immunization: Secondary | ICD-10-CM

## 2020-10-24 NOTE — Progress Notes (Signed)
    [facility]  Name:  Taylor Powell    MRN: [id number] DOB: [birth year]   10/24/2020  Taylor Powell was observed post [facility] immunization for 15 minutes without incident. He was provided with Vaccine Information Sheet and instruction to access the V-Safe system.   Taylor Powell was instructed to call 911 with any severe reactions post vaccine: Difficulty breathing  Swelling of face and throat  A fast heartbeat  A bad rash all over body  Dizziness and weakness

## 2020-12-05 ENCOUNTER — Ambulatory Visit: Payer: Commercial Managed Care - PPO

## 2020-12-30 ENCOUNTER — Telehealth: Payer: Self-pay

## 2020-12-30 NOTE — Telephone Encounter (Signed)
Attempted to schedule for 2nd Jynneos vaccine. Patient declines and would like to be removed from call back list as he states he no longer fits into the requirement risk factors for monkey pox. Valarie Cones

## 2021-01-29 ENCOUNTER — Ambulatory Visit: Payer: Commercial Managed Care - PPO | Admitting: Family Medicine

## 2021-02-23 ENCOUNTER — Other Ambulatory Visit: Payer: Self-pay

## 2021-02-23 ENCOUNTER — Ambulatory Visit (INDEPENDENT_AMBULATORY_CARE_PROVIDER_SITE_OTHER): Payer: Commercial Managed Care - PPO | Admitting: Family Medicine

## 2021-02-23 ENCOUNTER — Encounter: Payer: Self-pay | Admitting: Family Medicine

## 2021-02-23 VITALS — BP 128/82 | HR 78 | Temp 97.0°F | Ht 70.5 in | Wt 216.8 lb

## 2021-02-23 DIAGNOSIS — M79601 Pain in right arm: Secondary | ICD-10-CM

## 2021-02-23 DIAGNOSIS — Z1159 Encounter for screening for other viral diseases: Secondary | ICD-10-CM | POA: Diagnosis not present

## 2021-02-23 DIAGNOSIS — J301 Allergic rhinitis due to pollen: Secondary | ICD-10-CM

## 2021-02-23 DIAGNOSIS — E291 Testicular hypofunction: Secondary | ICD-10-CM | POA: Diagnosis not present

## 2021-02-23 DIAGNOSIS — G4730 Sleep apnea, unspecified: Secondary | ICD-10-CM

## 2021-02-23 DIAGNOSIS — Z125 Encounter for screening for malignant neoplasm of prostate: Secondary | ICD-10-CM

## 2021-02-23 DIAGNOSIS — R5383 Other fatigue: Secondary | ICD-10-CM

## 2021-02-23 DIAGNOSIS — Z0000 Encounter for general adult medical examination without abnormal findings: Secondary | ICD-10-CM

## 2021-02-23 DIAGNOSIS — R739 Hyperglycemia, unspecified: Secondary | ICD-10-CM

## 2021-02-23 DIAGNOSIS — M2241 Chondromalacia patellae, right knee: Secondary | ICD-10-CM

## 2021-02-23 DIAGNOSIS — Z1211 Encounter for screening for malignant neoplasm of colon: Secondary | ICD-10-CM

## 2021-02-23 NOTE — Progress Notes (Signed)
Subjective:    Patient ID: Taylor Powell, male    DOB: [birth year], 61 y.o.   MRN: [id number]  HPI He is here for complete examination.  He has been having difficulty recently with apneic episodes.  He apparently was diagnosed with OSA several years ago however stopped using CPAP because he could not tolerate it and apparently he lost weight making it unnecessary.  Now he is having difficulty with fatigue and stress related to his fatigue interfering with his ability to function.  He also has a history of allergic rhinitis and in the past has had shots.  He now does use Claritin/Allegra on an as-needed basis.  He also complains of right knee pain made especially worse when he sits for a long period of time or also when he is going down steps.  He does feel a grinding sensation but no popping or locking.  He also apparently injured his right forearm and arm while he was doing some weight training.  He now states that it still interferes especially with minor activities like picking up a glass.  He also has a history of hypogonadism but has not been on testosterone in quite some time.  His alcohol consumption is usually 1 possibly 2/day.  He also notes that he has gained weight recently.  His work and home life are going well.  He is married.  His family and social history as well as health maintenance and immunizations otherwise was reviewed.  Review of Systems  All other systems reviewed and are negative.     Objective:   Physical Exam Alert and in no distress. Tympanic membranes and canals are normal. Pharyngeal area is normal. Neck is supple without adenopathy or thyromegaly. Cardiac exam shows a regular sinus rhythm without murmurs or gallops. Lungs are clear to auscultation.  Abdominal exam shows no masses or tenderness. Exam of the right arm and forearm shows no palpable lesions.  There is some pain with flexion of the wrist but mainly pain in the elbow area.  Normal motion.  Exam of the right  knee does show grinding sensation the patella with compression testing causes some discomfort.  Negative anterior drawer.  Medial and lateral collateral ligaments intact.  Negative McMurray's testing.       Assessment & Plan:  Routine adult health maintenance - Plan: CBC with Differential/Platelet, Comprehensive metabolic panel, Lipid panel  Hypogonadism male: Testosterone ordered.  Seasonal allergic rhinitis due to pollen: Encouraged him to continue on Claritin/Allegra and also add Flonase to the regimen.  He needs to make sure that his allergy symptoms will not interfere with potentially getting on CPAP.  Fatigue, unspecified type - Plan: Testosterone, TSH  Need for hepatitis C screening test - Plan: Hepatitis C antibody  Screening for colon cancer - Plan: Cologuard  Chondromalacia of right patella - Plan: Ambulatory referral to Physical Therapy: I also gave instructions concerning terminal exercises for his knee.  He will return here as needed.  Right arm pain - Plan: Ambulatory referral to Physical Therapy: If physical therapy is unsuccessful I will then send to orthopedics as this is kind of an unusual injury.  Screening for prostate cancer - Plan: PSA  Sleep apnea, unspecified type - Plan: Home sleep test, Home sleep test Since he has had difficulty with CPAP in the past.  I also discussed the use of Inspire Also encouraged him to get on a diet and exercise regimen. Greater than 45 minutes was spent discussing the OSA, knee and  elbow issues as well as treatment of his underlying allergies

## 2021-02-24 LAB — COMPREHENSIVE METABOLIC PANEL
ALT: 152 IU/L — ABNORMAL HIGH (ref 0–44)
AST: 75 IU/L — ABNORMAL HIGH (ref 0–40)
Albumin/Globulin Ratio: 1.9 (ref 1.2–2.2)
Albumin: 4.8 g/dL (ref 3.8–4.9)
Alkaline Phosphatase: 97 IU/L (ref 44–121)
BUN/Creatinine Ratio: 17 (ref 9–20)
BUN: 16 mg/dL (ref 6–24)
Bilirubin Total: 0.4 mg/dL (ref 0.0–1.2)
CO2: 23 mmol/L (ref 20–29)
Calcium: 9.8 mg/dL (ref 8.7–10.2)
Chloride: 100 mmol/L (ref 96–106)
Creatinine, Ser: 0.96 mg/dL (ref 0.76–1.27)
Globulin, Total: 2.5 g/dL (ref 1.5–4.5)
Glucose: 114 mg/dL — ABNORMAL HIGH (ref 70–99)
Potassium: 4.8 mmol/L (ref 3.5–5.2)
Sodium: 141 mmol/L (ref 134–144)
Total Protein: 7.3 g/dL (ref 6.0–8.5)
eGFR: 91 mL/min/{1.73_m2} (ref >59)

## 2021-02-24 LAB — LIPID PANEL
Chol/HDL Ratio: 6 ratio — ABNORMAL HIGH (ref 0.0–5.0)
Cholesterol, Total: 276 mg/dL — ABNORMAL HIGH (ref 100–199)
HDL: 46 mg/dL (ref >39)
LDL Chol Calc (NIH): 160 mg/dL — ABNORMAL HIGH (ref 0–99)
Triglycerides: 369 mg/dL — ABNORMAL HIGH (ref 0–149)
VLDL Cholesterol Cal: 70 mg/dL — ABNORMAL HIGH (ref 5–40)

## 2021-02-24 LAB — CBC WITH DIFFERENTIAL/PLATELET
Basophils Absolute: 0 10*3/uL (ref 0.0–0.2)
Basos: 1 %
EOS (ABSOLUTE): 0.2 10*3/uL (ref 0.0–0.4)
Eos: 2 %
Hematocrit: 48.8 % (ref 37.5–51.0)
Hemoglobin: 16.2 g/dL (ref 13.0–17.7)
Immature Grans (Abs): 0 10*3/uL (ref 0.0–0.1)
Immature Granulocytes: 1 %
Lymphocytes Absolute: 3.5 10*3/uL — ABNORMAL HIGH (ref 0.7–3.1)
Lymphs: 45 %
MCH: 31 pg (ref 26.6–33.0)
MCHC: 33.2 g/dL (ref 31.5–35.7)
MCV: 94 fL (ref 79–97)
Monocytes Absolute: 0.5 10*3/uL (ref 0.1–0.9)
Monocytes: 7 %
Neutrophils Absolute: 3.3 10*3/uL (ref 1.4–7.0)
Neutrophils: 44 %
Platelets: 300 10*3/uL (ref 150–450)
RBC: 5.22 x10E6/uL (ref 4.14–5.80)
RDW: 12.1 % (ref 11.6–15.4)
WBC: 7.6 10*3/uL (ref 3.4–10.8)

## 2021-02-24 LAB — HEPATITIS C ANTIBODY: Hep C Virus Ab: 0.2 s/co ratio (ref 0.0–0.9)

## 2021-02-24 LAB — TSH: TSH: 2.19 u[IU]/mL (ref 0.450–4.500)

## 2021-02-24 LAB — PSA: Prostate Specific Ag, Serum: 1.3 ng/mL (ref 0.0–4.0)

## 2021-02-24 LAB — TESTOSTERONE: Testosterone: 205 ng/dL — ABNORMAL LOW (ref 264–916)

## 2021-02-26 ENCOUNTER — Telehealth: Payer: Self-pay

## 2021-02-26 LAB — SPECIMEN STATUS REPORT

## 2021-02-26 LAB — HGB A1C W/O EAG: Hgb A1c MFr Bld: 6.9 % — ABNORMAL HIGH (ref 4.8–5.6)

## 2021-02-26 MED ORDER — TESTOSTERONE 20.25 MG/ACT (1.62%) TD GEL: 1.0000 | Freq: Every day | TRANSDERMAL | 5 refills | Status: DC

## 2021-02-26 NOTE — Addendum Note (Signed)
Addended by: Ronnald Nian on: 02/26/2021 09:21 AM   Modules accepted: Orders

## 2021-02-26 NOTE — Addendum Note (Signed)
Addended by: Ronnald Nian on: 02/26/2021 09:23 AM   Modules accepted: Orders

## 2021-02-26 NOTE — Telephone Encounter (Signed)
[facility] fax for PA for patients testosterone, submitted through cover my meds and prescription approved :  Approvedtoday CaseId:[id number];Status:Approved;Review Type:Prior Auth;Coverage Start Date:01/27/2021;Coverage End Date:02/26/2022;

## 2021-03-02 ENCOUNTER — Ambulatory Visit (INDEPENDENT_AMBULATORY_CARE_PROVIDER_SITE_OTHER): Payer: Commercial Managed Care - PPO | Admitting: Rehabilitative and Restorative Service Providers"

## 2021-03-02 ENCOUNTER — Other Ambulatory Visit: Payer: Self-pay

## 2021-03-02 ENCOUNTER — Encounter: Payer: Self-pay | Admitting: Rehabilitative and Restorative Service Providers"

## 2021-03-02 DIAGNOSIS — M25561 Pain in right knee: Secondary | ICD-10-CM | POA: Diagnosis not present

## 2021-03-02 DIAGNOSIS — M79601 Pain in right arm: Secondary | ICD-10-CM | POA: Diagnosis not present

## 2021-03-02 DIAGNOSIS — M25562 Pain in left knee: Secondary | ICD-10-CM

## 2021-03-02 DIAGNOSIS — M6281 Muscle weakness (generalized): Secondary | ICD-10-CM | POA: Diagnosis not present

## 2021-03-02 DIAGNOSIS — R262 Difficulty in walking, not elsewhere classified: Secondary | ICD-10-CM

## 2021-03-02 DIAGNOSIS — G8929 Other chronic pain: Secondary | ICD-10-CM

## 2021-03-02 NOTE — Therapy (Signed)
[facility] [address] [place], [place], [address] Phone: [phone]   Fax:  [phone]  Physical Therapy Evaluation  Patient Details  Name: Taylor Powell MRN: [id number] Date of Birth: 02/27/1960 Referring Provider (PT): Ronnald Nian, MD   Encounter Date: 03/02/2021   PT End of Session - 03/02/21 0755     Visit Number 1    Number of Visits 20    Date for PT Re-Evaluation 05/11/21    Authorization Type [employer] UMR 20%    Progress Note Due on Visit 10    PT Start Time 0800    PT Stop Time 0846    PT Time Calculation (min) 46 min    Activity Tolerance Patient tolerated treatment well    Behavior During Therapy [facility] for tasks assessed/performed             Past Medical History:  Diagnosis Date   Anxiety    Hyperlipidemia    Hypertension    Hypogonadism male 2007    History reviewed. No pertinent surgical history.  There were no vitals filed for this visit.    Subjective Assessment - 03/02/21 0759     Subjective Pt. came to clinic c referral for bilateral patellar chondromalacia pain and Rt arm pain (forearm/biceps region).  Pt. saw primary MD about symptoms.  Pt. indicated Rt knee pain > Lt.  Pt. indicated he had to stop gym activity due to symptoms.  Pt. indicated consistent noise and pain in Rt knee in front/side/back at times.  Pt. indicated he previously did bike activity, rowing, etc but hasn't due to symptoms.  Pt. indicated Rt knee pain injury in past around 61 years old or so but didn't fully recover.  Pt. indicated Lt leg tends to give way but doesn't really cause pain complaints (going down stairs most noted).  Pt. indicated a pulling in Rt arm when lifting dog about 6 months ago, noted in front of upper arm.  Pt. indicated lifting glass of water is painful, can't curl but can pull down weight.  Pt. indicated he started some internet based exercises and wearing compression sleeve.  Pt. indicated overall symptoms haven't  changed c use of sleeve.  Some trouble sleeping at times.    Pertinent History hyperlipidemia, HTN    Limitations Standing;Walking;House hold activities;Lifting    Patient Stated Goals Reduce pain, get back to exercise routine, daily activity    Currently in Pain? Yes    Pain Score 6    pain at worst   Pain Location Knee    Pain Orientation Right;Left    Pain Descriptors / Indicators Other (Comment)   uncomfortable feeling   Pain Type Chronic pain    Pain Onset More than a month ago    Pain Frequency Intermittent    Aggravating Factors  bending, going up stairs, down stairs, squat, exercise routine    Pain Relieving Factors sitting on barstool    Effect of Pain on Daily Activities Limited in exercise routine, stairs, walking    Multiple Pain Sites Yes    Pain Score 6   pain at worst   Pain Location Arm    Pain Orientation Right   anterior distal upper arm, elbow, forearm   Pain Descriptors / Indicators Sharp;Tender    Pain Type Chronic pain    Pain Onset More than a month ago    Pain Frequency Intermittent    Aggravating Factors  lifting light weight up, reaching    Pain Relieving Factors  rest to avoid movement                [facility] - 03/02/21 0001       Assessment   Medical Diagnosis M22.41 (ICD-10-CM) - Chondromalacia of right patella  M79.601 (ICD-10-CM) - Right arm pain (forearm/biceps)    Referring Provider (PT) Ronnald NianLalonde, John C, MD    Onset Date/Surgical Date 08/15/20   approx. 6 months for Rt arm, knee longer duration overall   Hand Dominance Right      Precautions   Precautions None      Balance Screen   Has the patient fallen in the past 6 months No    Has the patient had a decrease in activity level because of a fear of falling?  No    Is the patient reluctant to leave their home because of a fear of falling?  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      Cognition   Overall Cognitive Status Within Functional Limits for tasks assessed      Observation/Other Assessments   Focus on Therapeutic Outcomes (FOTO)  intake 55%, predicted 61%      Sensation   Light Touch Appears Intact      Functional Tests   Functional tests Sit to Stand;Single leg stance      Single Leg Stance   Comments Lt SLS: 30 seconds, Rt SLS: mild increase in aberrant movement 30 seconds      Sit to Stand   Comments 18 inch chair s UE assist c pain upon lowering.      ROM / Strength   AROM / PROM / Strength AROM;PROM;Strength      AROM   Overall AROM Comments WFL Rt shoulder and elbow active range.  Pain noted in full elbow extension end range Rt    AROM Assessment Site Shoulder;Elbow;Knee    Right/Left Knee Left;Right    Right Knee Extension 0    Right Knee Flexion 130    Left Knee Extension 0    Left Knee Flexion 130      Strength   Overall Strength Comments Pain noted c Rt elbow flexion, supination testing, Rt knee extension testing    Strength Assessment Site Shoulder;Elbow;Forearm;Hip;Knee    Right/Left Shoulder Right;Left    Right Shoulder Flexion 5/5    Right Shoulder ABduction 5/5    Right Shoulder Internal Rotation 5/5    Right Shoulder External Rotation 5/5    Left Shoulder Flexion 5/5    Left Shoulder ABduction 5/5    Left Shoulder Internal Rotation 5/5    Left Shoulder External Rotation 5/5    Right/Left Elbow Left;Right    Right Elbow Flexion 4/5   19.8, 19.5   Right Elbow Extension 5/5    Left Elbow Flexion 5/5   68, 58 lbs   Left Elbow Extension 5/5    Right/Left Forearm Right;Left    Right Forearm Pronation 5/5    Right Forearm Supination 4/5    Left Forearm Pronation 5/5    Left Forearm Supination 5/5    Right/Left Hip Left;Right    Right Hip Flexion 5/5    Right  Hip ABduction 4/5  Left Hip Flexion 5/5    Left Hip ABduction 4/5    Right/Left Knee Left;Right    Right Knee Flexion 5/5    Right Knee Extension 4/5   42, 42.2 lbs   Left Knee Flexion 5/5    Left Knee Extension 5/5   57, 62 lbs     Palpation   Palpation comment tenderness to touch middle and distal biceps Rt                        Objective measurements completed on examination: See above findings.       [facility] Adult PT Treatment/Exercise - 03/02/21 0001       Exercises   Exercises Other Exercises    Other Exercises  HEP instruction/performance c cues for techniques, handout provided.  Trial set performed of each for comprehension and symptom assessment.  HEP consisting of eceentric sit transfer s UE assist, seated SLR bilateral, standing hip abduction into doorway 5 sec hold, eccentric Rt biceps loading green band.  Additional time spent in education on progressive exercise return planning and adaptions based off symptom response (not worsening symptoms from intervention, etc)                     PT Education - 03/02/21 0755     Education Details HEP, POC    Person(s) Educated Patient    Methods Explanation;Demonstration;Verbal cues;Handout    Comprehension Verbalized understanding;Returned demonstration              PT Short Term Goals - 03/02/21 0755       PT SHORT TERM GOAL #1   Title Patient will demonstrate independent use of home exercise program to maintain progress from in clinic treatments.    Time 3    Period Weeks    Status New    Target Date 03/23/21               PT Long Term Goals - 03/02/21 0756       PT LONG TERM GOAL #1   Title Patient will demonstrate/report pain at worst less than or equal to 2/10 to facilitate minimal limitation in daily activity secondary to pain symptoms.    Time 10    Period Weeks    Status New    Target Date 05/11/21      PT LONG TERM GOAL #2   Title Patient will demonstrate  independent use of home exercise program to facilitate ability to maintain/progress functional gains from skilled physical therapy services.    Time 10    Period Weeks    Status New    Target Date 05/11/21      PT LONG TERM GOAL #3   Title Pt. will demonstrate FOTO outcome > or = 61 % to indicated reduced disability due to condition.    Time 10    Period Weeks    Status New    Target Date 05/11/21      PT LONG TERM GOAL #4   Title Patient will demonstrate bilateral LE MMT 5/5, knee dynamometry with 15% s symptoms throughout to facilitate ability to perform usual standing, walking, stairs at PLOF s limitation due to symptoms.    Time 10    Period Weeks    Status New    Target Date 05/11/21      PT LONG TERM GOAL #5   Title Pt. will demonstrate Rt elbow flexion MMT 5/5, within 15 % dynamometry  of Lt s symptoms to facilitate usual lifting, reaching, pulling at PLOF.    Time 10    Period Weeks    Status New    Target Date 05/11/21      Additional Long Term Goals   Additional Long Term Goals Yes      PT LONG TERM GOAL #6   Title Pt. will demonstrate reciprocal gait pattern on stairs s UE assist for household navigation.    Time 10    Period Weeks    Status New    Target Date 05/11/21      PT LONG TERM GOAL #7   Title Pt. will demonstrate sit to stand to sit 18 inch chair s UE assist s symptoms.    Time 10    Period Weeks    Status New    Target Date 05/11/21                    Plan - 03/02/21 0757     Clinical Impression Statement Patient is a 61 y.o. who comes to clinic with complaints of bilateral knee pain, Rt arm pain with mobility, strength and movement coordination deficits that impair their ability to perform usual daily and recreational functional activities without increase difficulty/symptoms at this time.  Patient to benefit from skilled PT services to address impairments and limitations to improve to previous level of function without restriction  secondary to condition.    Personal Factors and Comorbidities Comorbidity 2    Comorbidities hyperlipidemia, HTN    Examination-Activity Limitations Sit;Sleep;Bend;Squat;Stairs;Carry;Stand;Transfers;Lift;Reach Overhead;Locomotion Level    Examination-Participation Restrictions Community Activity;Occupation;Yard Work;Interpersonal Relationship    Stability/Clinical Decision Making Evolving/Moderate complexity    Clinical Decision Making Moderate    Rehab Potential Good    PT Frequency Other (comment)   1-2x/week   PT Duration Other (comment)   10 weeks   PT Treatment/Interventions ADLs/Self Care Home Management;Cryotherapy;Electrical Stimulation;Iontophoresis 4mg /ml Dexamethasone;Moist Heat;Balance training;Therapeutic exercise;Therapeutic activities;Functional mobility training;Stair training;Gait training;Ultrasound;Neuromuscular re-education;Patient/family education;Spinal Manipulations;Passive range of motion;Joint Manipulations;Dry needling;Taping;Manual techniques    PT Next Visit Plan Eccentric loading Rt elbow, leg press, 90-30 eccentric LAQ machine    PT Home Exercise Plan [email or web address]    Consulted and Agree with Plan of Care Patient             Patient will benefit from skilled therapeutic intervention in order to improve the following deficits and impairments:  Abnormal gait, Decreased endurance, Pain, Impaired UE functional use, Decreased strength, Decreased activity tolerance, Decreased balance, Decreased mobility, Difficulty walking, Improper body mechanics, Impaired perceived functional ability, Impaired flexibility, Decreased range of motion, Decreased coordination  Visit Diagnosis: Chronic pain of right knee  Chronic pain of left knee  Pain in right arm  Muscle weakness (generalized)  Difficulty walking     Problem List Patient Active Problem List   Diagnosis Date Noted   Essential hypertension 05/29/2014   Routine adult health maintenance 11/21/2013   Insomnia  secondary to anxiety 11/21/2013   Hypogonadism male 11/21/2013   Hyperglycemia 11/21/2013   Taylor Powell, PT, DPT, OCS, ATC 03/02/21  9:06 AM    [facility] [address] [place], [place], [address] Phone: [phone]   Fax:  [phone]  Name: Taylor Powell MRN: [id number] Date of Birth: Jan 11, 1961

## 2021-03-02 NOTE — Patient Instructions (Addendum)
Access Code: [email or web address] URL: [email or web address] Date: 03/02/2021 Prepared by: Scot Jun  Exercises Seated Straight Leg Heel Taps - 1-2 x daily - 7 x weekly - 3 sets - 10 reps Eccentric Squat - 1-2 x daily - 7 x weekly - 3 sets - 10 reps Standing Single Arm Elbow Flexion with Resistance - 2-3 x daily - 7 x weekly - 3 sets - 10 reps Standing doorway hip hike c abduction into wall 5 sec hold

## 2021-03-03 ENCOUNTER — Encounter: Payer: Self-pay | Admitting: Family Medicine

## 2021-03-03 ENCOUNTER — Telehealth (INDEPENDENT_AMBULATORY_CARE_PROVIDER_SITE_OTHER): Payer: Commercial Managed Care - PPO | Admitting: Family Medicine

## 2021-03-03 VITALS — Temp 97.2°F | Wt 216.0 lb

## 2021-03-03 DIAGNOSIS — E109 Type 1 diabetes mellitus without complications: Secondary | ICD-10-CM | POA: Diagnosis not present

## 2021-03-03 NOTE — Progress Notes (Signed)
° °  Subjective:    Patient ID: Taylor Powell, male    DOB: 1960/10/20, 61 y.o.   MRN: [id number]  HPI Documentation for virtual audio and video telecommunications through [place] encounter: The patient was located at home. 2 patient identifiers used.  The provider was located in the office. The patient did consent to this visit and is aware of possible charges through their insurance for this visit. The other persons participating in this telemedicine service were none. Time spent on call was 5 minutes and in review of previous records >32 minutes total for counseling and coordination of care. This virtual service is not related to other E/M service within previous 7 days.  He recently had a hemoglobin A1c done which was 6.9.  Today's visit is to discuss this in more detail.  Review of Systems     Objective:   Physical Exam Alert and in no distress otherwise not examined       Assessment & Plan:  New onset of diabetes mellitus in pediatric patient ([place]) I discussed diabetes in regard to what exactly hemoglobin A1c means.  I discussed microvascular and macrovascular issues.  Potential use of medications for diabetes as well as for blood pressure and cholesterol.  Discussed more frequent but smaller meals and cutting back on carbohydrates.  Discussed lifestyle change to help with weight reduction rather than making weight the main issue.  I did not go into detail about foot care and eye care yet.  We also discussed diabetes in regard to stroke, blindness, heart failure, kidney failure and neurologic complications. Over 30 minutes spent discussing all these issues with him.  He will recheck with me in 1 month.

## 2021-03-03 NOTE — Progress Notes (Signed)
° °  Subjective:    Patient ID: Taylor Powell, male    DOB: Jul 14, 1960, 61 y.o.   MRN: [id number]  HPI Documentation for virtual audio and video telecommunications through [place] encounter: The patient was located at home. 2 patient identifiers used.  The provider was located in the office. The patient did consent to this visit and is aware of possible charges through their insurance for this visit. The other persons participating in this telemedicine service were none. Time spent on call was 5 minutes and in review of previous records >32 minutes total for counseling and coordination of care. This virtual service is not related to other E/M service within previous 7 days.    Review of Systems     Objective:   Physical Exam Alert and in no distress otherwise not examined       Assessment & Plan:  New onset of diabetes mellitus in pediatric patient [facility]) Discussed diabetes in regard to hemoglobin A1c and what it means.  We then discussed risk of diabetes in regard to stroke, blindness, heart failure, kidney failure, nerve and vascular disease. Discussed diet especially and cutting back on carbohydrates and having a more balanced diet.  Recommend regular exercise.  Did not discuss foot care and eye care as well as routine checking of blood sugars yet.  All of his questions were answered.  We will retouch bases in 1 month.  Approximately 60minutes spent discussing all these issues with him.

## 2021-03-06 LAB — COLOGUARD: COLOGUARD: NEGATIVE

## 2021-03-12 ENCOUNTER — Encounter: Payer: Commercial Managed Care - PPO | Admitting: Rehabilitative and Restorative Service Providers"

## 2021-03-16 ENCOUNTER — Other Ambulatory Visit: Payer: Self-pay

## 2021-03-16 ENCOUNTER — Ambulatory Visit: Payer: Commercial Managed Care - PPO | Admitting: Rehabilitative and Restorative Service Providers"

## 2021-03-16 ENCOUNTER — Encounter: Payer: Self-pay | Admitting: Rehabilitative and Restorative Service Providers"

## 2021-03-16 DIAGNOSIS — M25562 Pain in left knee: Secondary | ICD-10-CM

## 2021-03-16 DIAGNOSIS — R262 Difficulty in walking, not elsewhere classified: Secondary | ICD-10-CM

## 2021-03-16 DIAGNOSIS — M6281 Muscle weakness (generalized): Secondary | ICD-10-CM

## 2021-03-16 DIAGNOSIS — M79601 Pain in right arm: Secondary | ICD-10-CM

## 2021-03-16 DIAGNOSIS — M25561 Pain in right knee: Secondary | ICD-10-CM

## 2021-03-16 DIAGNOSIS — G8929 Other chronic pain: Secondary | ICD-10-CM

## 2021-03-16 NOTE — Patient Instructions (Signed)
Access Code: [email or web address] URL: [email or web address] Date: 03/16/2021 Prepared by: Chyrel Masson  Exercises Seated Straight Leg Heel Taps - 1-2 x daily - 7 x weekly - 3 sets - 10 reps Eccentric Squat - 1-2 x daily - 7 x weekly - 3 sets - 10 reps Standing Single Arm Elbow Flexion with Resistance (Mirrored) - 2-3 x daily - 7 x weekly - 3 sets - 10 reps Seated Wrist Radial Deviation with Anchored Resistance (Mirrored) - 2 x daily - 7 x weekly - 3 sets - 10 reps Isometric Wrist Pronation - 2 x daily - 7 x weekly - 1 sets - 5 reps - 30 hold Eccentric Knee Extension with Weight Machine - 1 x daily - 7 x weekly - 3 sets - 10 reps Single Leg Press - 1 x daily - 7 x weekly - 3 sets - 10 reps

## 2021-03-16 NOTE — Therapy (Signed)
[facility] [address] [place], [place], [address] Phone: [phone]   Fax:  [phone]  Physical Therapy Treatment  Patient Details  Name: Taylor Powell MRN: [id number] Date of Birth: October 04, 1960 Referring Provider (PT): Ronnald NianLalonde, John C, MD   Encounter Date: 03/16/2021   PT End of Session - 03/16/21 0841     Visit Number 2    PT Start Time 0800    PT Stop Time 0841    PT Time Calculation (min) 41 min    Activity Tolerance Patient tolerated treatment well    Behavior During Therapy [facility] for tasks assessed/performed             Past Medical History:  Diagnosis Date   Anxiety    Hyperlipidemia    Hypertension    Hypogonadism male 2007    History reviewed. No pertinent surgical history.  There were no vitals filed for this visit.   Subjective Assessment - 03/16/21 0801     Subjective Pt. indicated no specific pain upon arrival.  Pt. indicated feeling about the same or a little bit better since the last visit. Pt. indicated he did a hike and had Rt knee shooting pain after 1 mile or 2.  Pt. indicated having some complaints Rt "old sciatica thing"    Pertinent History hyperlipidemia, HTN    Limitations Standing;Walking;House hold activities;Lifting    Patient Stated Goals Reduce pain, get back to exercise routine, daily activity    Currently in Pain? No/denies    Pain Onset More than a month ago    Pain Score 0    Pain Onset More than a month ago                [facility] - 03/16/21 0001       Assessment   Medical Diagnosis M22.41 (ICD-10-CM) - Chondromalacia of right patella  M79.601 (ICD-10-CM) - Right arm pain (forearm/biceps)    Referring Provider (PT) Ronnald NianLalonde, John C, MD    Onset Date/Surgical Date 08/15/20    Hand Dominance Right      Strength   Right Elbow Flexion 5/5   34, 37 lbs                          [facility] Adult PT Treatment/Exercise - 03/16/21 0001       Exercises    Exercises Knee/Hip;Elbow    Other Exercises  Extended time for cues for home use for new intervention      Elbow Exercises   Other elbow exercises eccentric elbow flexion Rt c palm up and radius up 2 x 10 each      Knee/Hip Exercises: Machines for Strengthening   Cybex Knee Extension double leg extension to 25 deg, eccentric lowering 3 x 10 20 lbs      Knee/Hip Exercises: Seated   Other Seated Knee/Hip Exercises seated SLR x 15 bilateral                     PT Education - 03/16/21 0836     Education Details HEP progression, cues for intervention    Person(s) Educated Patient    Methods Explanation;Demonstration;Verbal cues;Handout    Comprehension Returned demonstration;Verbalized understanding              PT Short Term Goals - 03/16/21 0819       PT SHORT TERM GOAL #1   Title Patient will demonstrate independent use of home exercise program  to maintain progress from in clinic treatments.    Time 3    Period Weeks    Status On-going    Target Date 03/23/21               PT Long Term Goals - 03/02/21 0756       PT LONG TERM GOAL #1   Title Patient will demonstrate/report pain at worst less than or equal to 2/10 to facilitate minimal limitation in daily activity secondary to pain symptoms.    Time 10    Period Weeks    Status New    Target Date 05/11/21      PT LONG TERM GOAL #2   Title Patient will demonstrate independent use of home exercise program to facilitate ability to maintain/progress functional gains from skilled physical therapy services.    Time 10    Period Weeks    Status New    Target Date 05/11/21      PT LONG TERM GOAL #3   Title Pt. will demonstrate FOTO outcome > or = 61 % to indicated reduced disability due to condition.    Time 10    Period Weeks    Status New    Target Date 05/11/21      PT LONG TERM GOAL #4   Title Patient will demonstrate bilateral LE MMT 5/5, knee dynamometry with 15% s symptoms throughout to  facilitate ability to perform usual standing, walking, stairs at PLOF s limitation due to symptoms.    Time 10    Period Weeks    Status New    Target Date 05/11/21      PT LONG TERM GOAL #5   Title Pt. will demonstrate Rt elbow flexion MMT 5/5, within 15 % dynamometry of Lt s symptoms to facilitate usual lifting, reaching, pulling at PLOF.    Time 10    Period Weeks    Status New    Target Date 05/11/21      Additional Long Term Goals   Additional Long Term Goals Yes      PT LONG TERM GOAL #6   Title Pt. will demonstrate reciprocal gait pattern on stairs s UE assist for household navigation.    Time 10    Period Weeks    Status New    Target Date 05/11/21      PT LONG TERM GOAL #7   Title Pt. will demonstrate sit to stand to sit 18 inch chair s UE assist s symptoms.    Time 10    Period Weeks    Status New    Target Date 05/11/21                   Plan - 03/16/21 0820     Clinical Impression Statement Noted improvement in elbow flexion dynamometry reading today.  Radial deviated elbow flexion most troublesome for patient today.  Addition of select HEP to continue training to improve muscle control and loading for UE/LE.  Continued skilled Pt services indicated at this time.    Personal Factors and Comorbidities Comorbidity 2    Comorbidities hyperlipidemia, HTN    Examination-Activity Limitations Sit;Sleep;Bend;Squat;Stairs;Carry;Stand;Transfers;Lift;Reach Overhead;Locomotion Level    Examination-Participation Restrictions Community Activity;Occupation;Yard Work;Interpersonal Relationship    Stability/Clinical Decision Making Evolving/Moderate complexity    Rehab Potential Good    PT Frequency Other (comment)   1-2x/week   PT Duration Other (comment)   10 weeks   PT Treatment/Interventions ADLs/Self Care Home Management;Cryotherapy;Electrical Stimulation;Iontophoresis 4mg /ml Dexamethasone;Moist Heat;Balance  training;Therapeutic exercise;Therapeutic  activities;Functional mobility training;Stair training;Gait training;Ultrasound;Neuromuscular re-education;Patient/family education;Spinal Manipulations;Passive range of motion;Joint Manipulations;Dry needling;Taping;Manual techniques    PT Next Visit Plan Review existing HEP to ensure good eccentric control focus.  Continue strengthening.    PT Home Exercise Plan [id number]    Consulted and Agree with Plan of Care Patient             Patient will benefit from skilled therapeutic intervention in order to improve the following deficits and impairments:  Abnormal gait, Decreased endurance, Pain, Impaired UE functional use, Decreased strength, Decreased activity tolerance, Decreased balance, Decreased mobility, Difficulty walking, Improper body mechanics, Impaired perceived functional ability, Impaired flexibility, Decreased range of motion, Decreased coordination  Visit Diagnosis: Chronic pain of right knee  Chronic pain of left knee  Pain in right arm  Muscle weakness (generalized)  Difficulty walking     Problem List Patient Active Problem List   Diagnosis Date Noted   Essential hypertension 05/29/2014   Routine adult health maintenance 11/21/2013   Insomnia secondary to anxiety 11/21/2013   Hypogonadism male 11/21/2013   Hyperglycemia 11/21/2013   Chyrel Masson, PT, DPT, OCS, ATC 03/16/21  8:41 AM    [facility] [address] [place], [place], [address] Phone: [phone]   Fax:  [phone]  Name: Taylor Powell MRN: [id number] Date of Birth: 08-Jun-1960

## 2021-03-23 ENCOUNTER — Encounter: Payer: Self-pay | Admitting: Rehabilitative and Restorative Service Providers"

## 2021-03-23 ENCOUNTER — Other Ambulatory Visit: Payer: Self-pay

## 2021-03-23 ENCOUNTER — Ambulatory Visit (HOSPITAL_BASED_OUTPATIENT_CLINIC_OR_DEPARTMENT_OTHER): Payer: Commercial Managed Care - PPO | Attending: Family Medicine | Admitting: Internal Medicine

## 2021-03-23 ENCOUNTER — Ambulatory Visit: Payer: Commercial Managed Care - PPO | Admitting: Rehabilitative and Restorative Service Providers"

## 2021-03-23 DIAGNOSIS — R0683 Snoring: Secondary | ICD-10-CM | POA: Insufficient documentation

## 2021-03-23 DIAGNOSIS — M79601 Pain in right arm: Secondary | ICD-10-CM

## 2021-03-23 DIAGNOSIS — F419 Anxiety disorder, unspecified: Secondary | ICD-10-CM | POA: Insufficient documentation

## 2021-03-23 DIAGNOSIS — G4700 Insomnia, unspecified: Secondary | ICD-10-CM | POA: Insufficient documentation

## 2021-03-23 DIAGNOSIS — G4730 Sleep apnea, unspecified: Secondary | ICD-10-CM | POA: Diagnosis present

## 2021-03-23 DIAGNOSIS — R454 Irritability and anger: Secondary | ICD-10-CM | POA: Diagnosis not present

## 2021-03-23 DIAGNOSIS — G4710 Hypersomnia, unspecified: Secondary | ICD-10-CM | POA: Diagnosis not present

## 2021-03-23 DIAGNOSIS — R5383 Other fatigue: Secondary | ICD-10-CM | POA: Insufficient documentation

## 2021-03-23 DIAGNOSIS — G478 Other sleep disorders: Secondary | ICD-10-CM | POA: Diagnosis not present

## 2021-03-23 DIAGNOSIS — G4733 Obstructive sleep apnea (adult) (pediatric): Secondary | ICD-10-CM | POA: Diagnosis not present

## 2021-03-23 DIAGNOSIS — M25562 Pain in left knee: Secondary | ICD-10-CM | POA: Diagnosis not present

## 2021-03-23 DIAGNOSIS — M25561 Pain in right knee: Secondary | ICD-10-CM | POA: Diagnosis not present

## 2021-03-23 DIAGNOSIS — M6281 Muscle weakness (generalized): Secondary | ICD-10-CM | POA: Diagnosis not present

## 2021-03-23 DIAGNOSIS — R262 Difficulty in walking, not elsewhere classified: Secondary | ICD-10-CM

## 2021-03-23 DIAGNOSIS — G8929 Other chronic pain: Secondary | ICD-10-CM

## 2021-03-23 NOTE — Therapy (Addendum)
[facility] Physical Therapy [address] [place], [place], [address] Phone: [phone]   Fax:  [phone]  Physical Therapy Treatment /Discharge   Patient Details  Name: Taylor Powell MRN: [id number] Date of Birth: 01-28-1961 Referring Provider (PT): Denita Lung, MD   Encounter Date: 03/23/2021   PT End of Session - 03/23/21 0755     Visit Number 3    Number of Visits 20    Date for PT Re-Evaluation 05/11/21    Progress Note Due on Visit 10    PT Start Time 0758    PT Stop Time 0837    PT Time Calculation (min) 39 min    Activity Tolerance Patient tolerated treatment well    Behavior During Therapy [facility] for tasks assessed/performed             Past Medical History:  Diagnosis Date   Anxiety    Hyperlipidemia    Hypertension    Hypogonadism male 2007    History reviewed. No pertinent surgical history.  There were no vitals filed for this visit.   Subjective Assessment - 03/23/21 0759     Subjective Pt. indicated doing better overall but still can notice trouble c squats with legs.  Pt. indicated having several days of no real complaints from Rt but does have occasional instances of lifing/holding that can be troublesome.    Pertinent History hyperlipidemia, HTN    Limitations Standing;Walking;House hold activities;Lifting    Patient Stated Goals Reduce pain, get back to exercise routine, daily activity    Currently in Pain? No/denies    Pain Onset More than a month ago    Pain Score 0    Pain Onset More than a month ago                [facility] PT Assessment - 03/23/21 0001       Assessment   Medical Diagnosis M22.41 (ICD-10-CM) - Chondromalacia of right patella  M79.601 (ICD-10-CM) - Right arm pain (forearm/biceps)    Referring Provider (PT) Denita Lung, MD    Onset Date/Surgical Date 08/15/20    Hand Dominance Right      Observation/Other Assessments   Focus on Therapeutic Outcomes (FOTO)  updated 64%                            [facility] Adult PT Treatment/Exercise - 03/23/21 0001       Elbow Exercises   Other elbow exercises standing tband rows x 20    Other elbow exercises chest press machine 65 lbs x 15, lat pull down 25 lbs x 15 (reviewed for gym use)      Knee/Hip Exercises: Aerobic   Recumbent Bike Lvl 3 5 mins      Knee/Hip Exercises: Machines for Strengthening   Cybex Knee Extension double leg extension to 25 deg, eccentric lowering 3 x 10 20 lbs, performed bilateral    Total Gym Leg Press Double leg 125 lbs 2 x 15, single leg                       PT Short Term Goals - 03/23/21 6195       PT SHORT TERM GOAL #1   Title Patient will demonstrate independent use of home exercise program to maintain progress from in clinic treatments.    Time 3    Period Weeks    Status Achieved    Target Date 03/23/21  PT Long Term Goals - 03/23/21 1610       PT LONG TERM GOAL #1   Title Patient will demonstrate/report pain at worst less than or equal to 2/10 to facilitate minimal limitation in daily activity secondary to pain symptoms.    Time 10    Period Weeks    Status On-going    Target Date 05/11/21      PT LONG TERM GOAL #2   Title Patient will demonstrate independent use of home exercise program to facilitate ability to maintain/progress functional gains from skilled physical therapy services.    Time 10    Period Weeks    Status On-going    Target Date 05/11/21      PT LONG TERM GOAL #3   Title Pt. will demonstrate FOTO outcome > or = 61 % to indicated reduced disability due to condition.    Time 10    Period Weeks    Status Achieved    Target Date 05/11/21      PT LONG TERM GOAL #4   Title Patient will demonstrate bilateral LE MMT 5/5, knee dynamometry with 15% s symptoms throughout to facilitate ability to perform usual standing, walking, stairs at PLOF s limitation due to symptoms.    Time 10    Period Weeks    Status On-going     Target Date 05/11/21      PT LONG TERM GOAL #5   Title Pt. will demonstrate Rt elbow flexion MMT 5/5, within 15 % dynamometry of Lt s symptoms to facilitate usual lifting, reaching, pulling at PLOF.    Time 10    Period Weeks    Status On-going    Target Date 05/11/21      PT LONG TERM GOAL #6   Title Pt. will demonstrate reciprocal gait pattern on stairs s UE assist for household navigation.    Time 10    Period Weeks    Status On-going    Target Date 05/11/21      PT LONG TERM GOAL #7   Title Pt. will demonstrate sit to stand to sit 18 inch chair s UE assist s symptoms.    Time 10    Period Weeks    Status On-going    Target Date 05/11/21                   Plan - 03/23/21 0820     Clinical Impression Statement Pt. to continue to benefit from progressive loading strengthening for both LE and arm to improve strength control and functional movement.  Due to improvements to this point, recommendation for reduced frequency of visits to allow more time to progression c use of HEP indicated at this time.    Personal Factors and Comorbidities Comorbidity 2    Comorbidities hyperlipidemia, HTN    Examination-Activity Limitations Sit;Sleep;Bend;Squat;Stairs;Carry;Stand;Transfers;Lift;Reach Overhead;Locomotion Level    Examination-Participation Restrictions Community Activity;Occupation;Yard Work;Interpersonal Relationship    Stability/Clinical Decision Making Evolving/Moderate complexity    Rehab Potential Good    PT Frequency Other (comment)   1-2x/week   PT Duration Other (comment)   10 weeks   PT Treatment/Interventions ADLs/Self Care Home Management;Cryotherapy;Electrical Stimulation;Iontophoresis 1m/ml Dexamethasone;Moist Heat;Balance training;Therapeutic exercise;Therapeutic activities;Functional mobility training;Stair training;Gait training;Ultrasound;Neuromuscular re-education;Patient/family education;Spinal Manipulations;Passive range of motion;Joint  Manipulations;Dry needling;Taping;Manual techniques    PT Next Visit Plan Recheck HEP use, progressive strengthening program continued, functional stair/squat training.    PT Home Exercise Plan [email or web address]   Consulted and Agree with Plan of Care Patient  Patient will benefit from skilled therapeutic intervention in order to improve the following deficits and impairments:  Abnormal gait, Decreased endurance, Pain, Impaired UE functional use, Decreased strength, Decreased activity tolerance, Decreased balance, Decreased mobility, Difficulty walking, Improper body mechanics, Impaired perceived functional ability, Impaired flexibility, Decreased range of motion, Decreased coordination  Visit Diagnosis: Chronic pain of right knee  Chronic pain of left knee  Pain in right arm  Muscle weakness (generalized)  Difficulty walking     Problem List Patient Active Problem List   Diagnosis Date Noted   Essential hypertension 05/29/2014   Routine adult health maintenance 11/21/2013   Insomnia secondary to anxiety 11/21/2013   Hypogonadism male 11/21/2013   Hyperglycemia 11/21/2013    Scot Jun, PT, DPT, OCS, ATC 03/23/21  8:39 AM   PHYSICAL THERAPY DISCHARGE SUMMARY  Visits from Start of Care: 3  Current functional level related to goals / functional outcomes: See note   Remaining deficits: See note   Education / Equipment: HEP   Patient agrees to discharge. Patient goals were met. Patient is being discharged due to being pleased with the current functional level. Scot Jun, PT, DPT, OCS, ATC 04/02/21  7:44 AM      [facility] Physical Therapy [address] [place], [place], [address] Phone: [phone]   Fax:  [phone]  Name: Abrahan Fulmore MRN: [id number] Date of Birth: [birth year]

## 2021-03-25 ENCOUNTER — Encounter (HOSPITAL_BASED_OUTPATIENT_CLINIC_OR_DEPARTMENT_OTHER): Payer: Commercial Managed Care - PPO | Admitting: Internal Medicine

## 2021-03-29 DIAGNOSIS — G4730 Sleep apnea, unspecified: Secondary | ICD-10-CM

## 2021-03-29 NOTE — Procedures (Signed)
° ° °  Patient Name: Taylor Powell, Taylor Powell Date: 03/23/2021 Gender: Male D.O.B: 01-29-1961 Age (years): 18 Referring Provider: Ronnald Nian Height (inches): 71 Interpreting Physician: Jetty Duhamel MD, ABSM Weight (lbs): 200 RPSGT: Shelah Lewandowsky BMI: 28 MRN: [id number] Neck Size: 16.50  CLINICAL INFORMATION Sleep Study Type: HST Indication for sleep study: 780.54 Hypersomnia, Anxiety, Chronic snoring, Fatigue, Gasping During Sleep, Insomnia, Irritability, Nocturnal Gasping, OSA, Snoring, Witnesses Apnea / Gasping During Sleep Epworth Sleepiness Score: 8  SLEEP STUDY TECHNIQUE A multi-channel overnight portable sleep study was performed. The channels recorded were: nasal airflow, thoracic respiratory movement, and oxygen saturation with a pulse oximetry. Snoring was also monitored.  MEDICATIONS Patient self administered medications include: none reported.  SLEEP ARCHITECTURE Patient was studied for 411.5 minutes. The sleep efficiency was 100.0 % and the patient was supine for 7.9%. The arousal index was 0.0 per hour.  RESPIRATORY PARAMETERS The overall AHI was 64.9 per hour, with a central apnea index of 0 per hour. The oxygen nadir was 85% during sleep.  CARDIAC DATA Mean heart rate during sleep was 74.7 bpm.  IMPRESSIONS - Severe obstructive sleep apnea occurred during this study (AHI = 64.9/h). - Moderate oxygen desaturation was noted during this study (Min O2 = 85%). Mean O2 saturation 95%. - Patient snored.  DIAGNOSIS - Obstructive Sleep Apnea (G47.33)  RECOMMENDATIONS - Suggest CPAP titration sleep study or autopap. Other options would be based on clinical judgment. - Be careful with alcohol, sedatives and other CNS depressants that may worsen sleep apnea and disrupt normal sleep architecture. - Sleep hygiene should be reviewed to assess factors that may improve sleep quality. - Weight management and regular exercise should be initiated or  continued.  [Electronically signed] 03/29/2021 11:59 AM  Jetty Duhamel MD, ABSM Diplomate, American Board of Sleep Medicine   NPI: [id number]                         Jetty Duhamel Diplomate, American Board of Sleep Medicine  ELECTRONICALLY SIGNED ON:  03/29/2021, 11:57 AM [facility] PH: [phone]   FX: [phone] ACCREDITED BY THE AMERICAN ACADEMY OF SLEEP MEDICINE

## 2021-03-30 ENCOUNTER — Encounter: Payer: Commercial Managed Care - PPO | Admitting: Rehabilitative and Restorative Service Providers"

## 2021-04-01 ENCOUNTER — Telehealth: Payer: Self-pay | Admitting: Family Medicine

## 2021-04-01 NOTE — Telephone Encounter (Signed)
Done KH 

## 2021-04-01 NOTE — Progress Notes (Signed)
Sent pt a my chart message advising of the need for a virtual appt concerning sleep study . KH

## 2021-04-01 NOTE — Telephone Encounter (Signed)
Pt scheduled a Virtual Visit on Monday for sleep study followup.  Pt wants you to know he is not going back to [facility].   He asked several people several times what the cost would be and they would tell him he would be responsible for his 20.00 copay.  He is now getting bills for 300.00 and 400.00.  He wanted you to know

## 2021-04-06 ENCOUNTER — Encounter: Payer: Commercial Managed Care - PPO | Admitting: Rehabilitative and Restorative Service Providers"

## 2021-04-06 ENCOUNTER — Telehealth (INDEPENDENT_AMBULATORY_CARE_PROVIDER_SITE_OTHER): Payer: Commercial Managed Care - PPO | Admitting: Family Medicine

## 2021-04-06 ENCOUNTER — Encounter: Payer: Self-pay | Admitting: Family Medicine

## 2021-04-06 ENCOUNTER — Other Ambulatory Visit: Payer: Self-pay

## 2021-04-06 VITALS — BP 132/82 | Wt 200.0 lb

## 2021-04-06 DIAGNOSIS — G4733 Obstructive sleep apnea (adult) (pediatric): Secondary | ICD-10-CM | POA: Diagnosis not present

## 2021-04-06 DIAGNOSIS — J301 Allergic rhinitis due to pollen: Secondary | ICD-10-CM | POA: Diagnosis not present

## 2021-04-06 MED ORDER — TRAZODONE HCL 50 MG PO TABS: 25.0000 mg | ORAL_TABLET | Freq: Every evening | ORAL | 3 refills | Status: DC | PRN

## 2021-04-06 NOTE — Progress Notes (Signed)
° °  Subjective:    Patient ID: Taylor Powell, male    DOB: 03-24-1960, 61 y.o.   MRN: [id number]  HPI Documentation for virtual audio and video telecommunications through [place] encounter: The patient was located at home. 2 patient identifiers used.  The provider was located in the office. The patient did consent to this visit and is aware of possible charges through their insurance for this visit. The other persons participating in this telemedicine service were none. Time spent on call was 7 minutes and in review of previous records >26 minutes total for counseling and coordination of care. This virtual service is not related to other E/M service within previous 7 days.  He is here to discuss sleep apnea.  He recently had a home study done which showed a AHI of 64.9.  He also has a previous history of allergic rhinitis and presently is on Flonase.  He states that he can fall asleep and be able to breathe through his nose but later in the night he becomes congested.  He has had previous ENT procedure done on his sinuses but apparently was not successful.  He is concerned over his ability to sleep  Review of Systems     Objective:   Physical Exam Alert and in no distress otherwise not examined       Assessment & Plan:  OSA (obstructive sleep apnea) - Plan: For home use only DME continuous positive airway pressure (CPAP), traZODone (DESYREL) 50 MG tablet  Seasonal allergic rhinitis due to pollen I discussed the study with him in detail.  I will put him on the CPAP and auto titrate.  Hopefully we can get a mask that will work for him.  Discussed sleep hygiene in detail with him going over medications, exercise, alcohol, stimulants or sedatives.  Discussed possible referral to ENT if he continues have difficulty with nasal obstruction symptoms especially with his underlying history.  Discussed the use of Inspire.  Explained that we will need to get a CPAP readout on him after he has been on  nothing consistently for 1 month so he get proper follow-up.  Trazodone also called in in case he needs extra help due to wearing the appliance.

## 2021-04-06 NOTE — Progress Notes (Signed)
Patient states he has talked with several folks there.  I advised him to talk with manager.

## 2021-04-10 ENCOUNTER — Telehealth: Payer: Self-pay | Admitting: Family Medicine

## 2021-04-10 ENCOUNTER — Encounter: Payer: Commercial Managed Care - PPO | Admitting: Rehabilitative and Restorative Service Providers"

## 2021-04-10 NOTE — Telephone Encounter (Signed)
Pt needs specific order for CPAP machine, It has to state [employer], company will wend that one out but has to be specific on order  [facility]  Pt also wants [place] to ask [employer] to provide him with Cpap cost

## 2021-04-13 ENCOUNTER — Encounter: Payer: Commercial Managed Care - PPO | Admitting: Rehabilitative and Restorative Service Providers"

## 2021-04-13 NOTE — Telephone Encounter (Signed)
I have spoken with Taylor Powell and Taylor Powell with [facility] , they have talked with patient.  Both have advised they always tell patient to confirm with their insurance on cost.  Neither see he has a copay only co-insurance.  20% Coinsurance, 4000 deductible, 3396. Remain oop ded.  They both will continue to investigate and talk with patient further.

## 2021-04-13 NOTE — Addendum Note (Signed)
Addended by: Renelda Loma on: 04/13/2021 09:43 AM   Modules accepted: Orders

## 2021-04-20 ENCOUNTER — Encounter: Payer: Commercial Managed Care - PPO | Admitting: Rehabilitative and Restorative Service Providers"

## 2021-04-27 ENCOUNTER — Encounter: Payer: Commercial Managed Care - PPO | Admitting: Rehabilitative and Restorative Service Providers"

## 2021-05-04 ENCOUNTER — Encounter: Payer: Commercial Managed Care - PPO | Admitting: Rehabilitative and Restorative Service Providers"

## 2021-05-07 ENCOUNTER — Telehealth: Payer: Self-pay | Admitting: Family Medicine

## 2021-05-07 NOTE — Telephone Encounter (Signed)
LVM for pt to advise that a new vendor was contacted and I am awaiting to hear back from them. I also advised that I will be out of the office Friday and Monday if I do not speak to him before the day ends.  ? ?KH ?

## 2021-05-07 NOTE — Telephone Encounter (Signed)
Patient left message on voice mail that Patsy Lager was being very difficult to work with.  The lady was confrontational and he told her to cancel everything.  Please call patient to refer elsewhere. ?

## 2021-05-08 NOTE — Telephone Encounter (Signed)
A representative from [employer] called stating that they tried to contact the pt. Several times and he kept saying he needed to do more research before he wanted them to order the CPAP for him. Then the pt. Called back recently and wanted to get started on the CPAP but a different type. She told the pt. It may take a while to get it ordered because there was a Sport and exercise psychologist on CPAP supplies for a while and they arte trying to get all the orders caught up now. She said the pt. Became irritated and asked that they just cancel the order.  ?

## 2021-05-12 NOTE — Telephone Encounter (Signed)
I have forward this to [facility]. Taylor Powell and hope this will be a better fit for him. KH ?

## 2021-06-23 ENCOUNTER — Ambulatory Visit (INDEPENDENT_AMBULATORY_CARE_PROVIDER_SITE_OTHER): Payer: Commercial Managed Care - PPO | Admitting: Family Medicine

## 2021-06-23 ENCOUNTER — Encounter: Payer: Self-pay | Admitting: Family Medicine

## 2021-06-23 VITALS — BP 138/88 | HR 84 | Temp 97.9°F | Wt 219.8 lb

## 2021-06-23 DIAGNOSIS — E109 Type 1 diabetes mellitus without complications: Secondary | ICD-10-CM

## 2021-06-23 DIAGNOSIS — G4733 Obstructive sleep apnea (adult) (pediatric): Secondary | ICD-10-CM | POA: Diagnosis not present

## 2021-06-23 LAB — POCT GLYCOSYLATED HEMOGLOBIN (HGB A1C): Hemoglobin A1C: 6.5 % — AB (ref 4.0–5.6)

## 2021-06-23 MED ORDER — GLUCOSE BLOOD VI STRP
ORAL_STRIP | 12 refills | Status: AC
Start: 2021-06-23 — End: ?

## 2021-06-23 MED ORDER — ACCU-CHEK SOFTCLIX LANCETS MISC: 12 refills | Status: AC

## 2021-06-23 MED ORDER — OZEMPIC (0.25 OR 0.5 MG/DOSE) 2 MG/1.5ML ~~LOC~~ SOPN: 0.5000 mg | PEN_INJECTOR | SUBCUTANEOUS | 0 refills | Status: DC

## 2021-06-23 NOTE — Progress Notes (Signed)
?Subjective:  ? ? Patient ID: Linville Buckert, male    DOB: [birth year], 61 y.o.   MRN: [id number] ? ?Varney Haaf is a 61 y.o. male who presents for follow-up of Type 2 diabetes mellitus. ? ?Home blood sugar records:  not checking ; will provide meter today ?Current symptoms/problems include none and have been stable. ?Daily foot checks: N/A  Any foot concerns: N/A ?Exercise:  staying active  ?Diet: good he has made diet and exercise changes.  He has tried trazodone to help with sleep.  He is using his CPAP but is getting roughly 3 hours of sleep this per day and is interfering with his ability to function.  He states that the device tends to come off after several hours making it difficult for him to function.  He has been using half a dose of the trazodone.  He has been unable to tolerate Ambien and Belsomra from previous experiences so we did not go there.  He is also interested in getting Ozempic to help with diabetes and weight as he has gained weight in spite of doing a good job of taking care of himself.  His allergies seem to be under fairly good control and he states when he wears the nasal CPAP he breathes very well through his nose. ?The following portions of the patient's history were reviewed and updated as appropriate: allergies, current medications, past medical history, past social history and problem list. ? ?ROS as in subjective above. ? ?   ?Objective:  ?  ?Physical Exam ?Alert and in no distress otherwise not examined. ?Review of the record indicates he has actually gained a small amount of weight although the numbers on February 6 and 20 are low. ? ?Lab Review ? ?  Latest Ref Rng & Units 02/23/2021  ? 12:08 PM 07/04/2017  ?  4:21 PM 07/04/2017  ?  3:53 PM 01/20/2016  ?  9:07 PM 05/29/2014  ?  3:50 PM  ?Diabetic Labs  ?HbA1c 4.8 - 5.6 % 6.9   5.7       ?Chol 100 - 199 mg/dL 276    247    231    ?HDL >39 mg/dL 46    50    41.30    ?Calc LDL 0 - 99 mg/dL 160    141      ?Triglycerides 0 - 149 mg/dL 369     281    241.0    ?Creatinine 0.76 - 1.27 mg/dL 0.96    1.01   0.98   1.16    ?GFR >60.00 mL/min     70.09    ? ? ?  04/06/2021  ?  2:14 PM 03/23/2021  ?  3:06 PM 03/03/2021  ? 12:02 PM 02/23/2021  ? 11:08 AM 09/09/2017  ?  9:08 AM  ?BP/Weight  ?Systolic BP [id number]   0000000 [id number]  ?Diastolic BP 82   82 92  ?Wt. (Lbs) 200 200 216 216.8 199.4  ?BMI 27.89 kg/m2 27.89 kg/m2 30.55 kg/m2 30.67 kg/m2 27.81 kg/m2  ? ?   ? View : No data to display.  ?  ?  ?  ? ? ?Radhames  reports that he has never smoked. He has never used smokeless tobacco. He reports current alcohol use of about 6.0 standard drinks per week. He reports that he does not use drugs. ? ?   ?Assessment & Plan:  ?  ?OSA (obstructive sleep apnea) - Plan: Ambulatory referral to ENT, CT CARDIAC SCORING (SELF PAY  ONLY) ? ?New onset of diabetes mellitus in pediatric patient ([place]) - Plan: Accu-Chek Softclix Lancets lancets, glucose blood test strip, POCT glycosylated hemoglobin (Hb A1C), Semaglutide,0.25 or 0.5MG /DOS, (OZEMPIC, 0.25 OR 0.5 MG/DOSE,) 2 MG/1.5ML SOPN ?He is having a great deal of difficulty getting used to the CPAP.  I will have him increase his sleep pill to 1 pill/day to see if that will improve his sleep.  Discussed referral to ENT for Inspire and I will make the referral for that.  He will continue to work with his CPAP and continue with diet and exercise.  Sample of Ozempic given and instructions on proper use and possible side effects.  He will call me in 1 month to let me know how he is doing.  He is quite distraught over his lack of weight loss and difficulty with his sleep. ? ? ? ?  ?

## 2021-06-29 ENCOUNTER — Other Ambulatory Visit: Payer: Self-pay

## 2021-06-29 DIAGNOSIS — E109 Type 1 diabetes mellitus without complications: Secondary | ICD-10-CM

## 2021-06-29 MED ORDER — OZEMPIC (0.25 OR 0.5 MG/DOSE) 2 MG/1.5ML ~~LOC~~ SOPN: 0.5000 mg | PEN_INJECTOR | SUBCUTANEOUS | 0 refills | Status: DC

## 2021-06-30 ENCOUNTER — Telehealth: Payer: Self-pay

## 2021-06-30 NOTE — Telephone Encounter (Signed)
P.A. OZEMPIC  [id number];Review Type:Prior Auth;Coverage Start Date:05/31/2021;Coverage End Date:06/30/2022; went thru for $25, pt informed earlier today

## 2021-07-06 ENCOUNTER — Telehealth: Payer: Commercial Managed Care - PPO | Admitting: Family Medicine

## 2021-07-15 ENCOUNTER — Ambulatory Visit (HOSPITAL_COMMUNITY)
Admission: RE | Admit: 2021-07-15 | Discharge: 2021-07-15 | Disposition: A | Discharge status: Home / Self Care | Payer: Commercial Managed Care - PPO | Source: Ambulatory Visit | Attending: Family Medicine | Admitting: Family Medicine

## 2021-07-15 ENCOUNTER — Encounter: Payer: Self-pay | Admitting: Family Medicine

## 2021-07-15 DIAGNOSIS — G4733 Obstructive sleep apnea (adult) (pediatric): Secondary | ICD-10-CM | POA: Insufficient documentation

## 2021-07-16 ENCOUNTER — Telehealth: Payer: Self-pay

## 2021-07-16 NOTE — Telephone Encounter (Signed)
Pt wanted me to let you know he returned your call

## 2021-07-16 NOTE — Telephone Encounter (Signed)
Requested for pt to have appt . KH

## 2021-07-17 ENCOUNTER — Encounter: Payer: Self-pay | Admitting: Family Medicine

## 2021-07-17 ENCOUNTER — Telehealth (INDEPENDENT_AMBULATORY_CARE_PROVIDER_SITE_OTHER): Payer: Commercial Managed Care - PPO | Admitting: Family Medicine

## 2021-07-17 VITALS — BP 138/88 | Wt 212.0 lb

## 2021-07-17 DIAGNOSIS — E109 Type 1 diabetes mellitus without complications: Secondary | ICD-10-CM

## 2021-07-17 DIAGNOSIS — E1169 Type 2 diabetes mellitus with other specified complication: Secondary | ICD-10-CM

## 2021-07-17 DIAGNOSIS — E785 Hyperlipidemia, unspecified: Secondary | ICD-10-CM

## 2021-07-17 MED ORDER — ATORVASTATIN CALCIUM 20 MG PO TABS: 20.0000 mg | ORAL_TABLET | Freq: Every day | ORAL | 3 refills | Status: DC

## 2021-07-17 MED ORDER — SEMAGLUTIDE (1 MG/DOSE) 4 MG/3ML ~~LOC~~ SOPN: 1.0000 mg | PEN_INJECTOR | SUBCUTANEOUS | 1 refills | Status: DC

## 2021-07-17 NOTE — Progress Notes (Signed)
   Subjective:    Patient ID: Taylor Powell, male    DOB: 05/16/1960, 61 y.o.   MRN: [id number]  HPI Documentation for virtual audio and video telecommunications through [place] encounter: The patient was located at home. 2 patient identifiers used.  The provider was located in the office. The patient did consent to this visit and is aware of possible charges through their insurance for this visit. The other persons participating in this telemedicine service were none. Time spent on call was 5 minutes and in review of previous records >10 minutes total for counseling and coordination of care. This virtual service is not related to other E/M service within previous 7 days.  He recently had a coronary calcium score done which was roughly 30.  He has new onset diabetes.  He has had recent blood work done concerning his cholesterol.  He is has concerns about starting a statin drug.  He is also on Ozempic and would like that increased.  Review of Systems     Objective:   Physical Exam Alert and in no distress otherwise not examined       Assessment & Plan:   New onset of diabetes mellitus in pediatric patient (HCC) - Plan: Semaglutide, 1 MG/DOSE, 4 MG/3ML SOPN  Hyperlipidemia associated with type 2 diabetes mellitus (HCC) - Plan: atorvastatin (LIPITOR) 20 MG tablet I will increase his Ozempic to 1 mg/day.  I then discussed his cardiovascular risk looking at his calcium score as well as overall risk assessment.  Based on his numbers his risk is about 25%.  I explained this to him and did recommend that he get started on a statin drug to try and reduce his risk of heart disease.  He will follow-up me as previously scheduled.

## 2021-09-22 ENCOUNTER — Other Ambulatory Visit: Payer: Self-pay | Admitting: Family Medicine

## 2021-09-22 DIAGNOSIS — E109 Type 1 diabetes mellitus without complications: Secondary | ICD-10-CM

## 2021-10-21 ENCOUNTER — Encounter: Payer: Self-pay | Admitting: Family Medicine

## 2021-10-21 ENCOUNTER — Encounter: Payer: Self-pay | Admitting: Internal Medicine

## 2021-10-22 ENCOUNTER — Other Ambulatory Visit: Payer: Self-pay

## 2021-10-22 DIAGNOSIS — E109 Type 1 diabetes mellitus without complications: Secondary | ICD-10-CM

## 2021-10-22 MED ORDER — OZEMPIC (1 MG/DOSE) 4 MG/3ML ~~LOC~~ SOPN: PEN_INJECTOR | SUBCUTANEOUS | 0 refills | Status: DC

## 2021-10-22 NOTE — Telephone Encounter (Signed)
Done KH 

## 2021-11-20 ENCOUNTER — Telehealth: Payer: Self-pay | Admitting: Family Medicine

## 2021-11-20 ENCOUNTER — Other Ambulatory Visit: Payer: Self-pay

## 2021-11-20 DIAGNOSIS — E109 Type 1 diabetes mellitus without complications: Secondary | ICD-10-CM

## 2021-11-20 MED ORDER — OZEMPIC (1 MG/DOSE) 4 MG/3ML ~~LOC~~ SOPN: PEN_INJECTOR | SUBCUTANEOUS | 0 refills | Status: DC

## 2021-11-20 NOTE — Telephone Encounter (Signed)
Done KH 

## 2021-11-20 NOTE — Telephone Encounter (Signed)
Pt called and is requesting a refill on his ozempic please send to the  [id number] - [place], [place]

## 2021-11-27 ENCOUNTER — Ambulatory Visit: Payer: Commercial Managed Care - PPO | Admitting: Family Medicine

## 2021-12-07 ENCOUNTER — Encounter: Payer: Self-pay | Admitting: Internal Medicine

## 2021-12-15 ENCOUNTER — Other Ambulatory Visit: Payer: Self-pay | Admitting: Family Medicine

## 2021-12-15 DIAGNOSIS — E109 Type 1 diabetes mellitus without complications: Secondary | ICD-10-CM

## 2021-12-15 MED ORDER — OZEMPIC (1 MG/DOSE) 4 MG/3ML ~~LOC~~ SOPN: PEN_INJECTOR | SUBCUTANEOUS | 0 refills | Status: DC

## 2021-12-18 ENCOUNTER — Ambulatory Visit: Payer: Commercial Managed Care - PPO | Admitting: Family Medicine

## 2021-12-21 ENCOUNTER — Ambulatory Visit: Payer: Commercial Managed Care - PPO | Admitting: Family Medicine

## 2022-01-04 ENCOUNTER — Ambulatory Visit: Payer: Commercial Managed Care - PPO | Admitting: Family Medicine

## 2022-01-11 ENCOUNTER — Encounter: Payer: Self-pay | Admitting: Family Medicine

## 2022-01-11 ENCOUNTER — Ambulatory Visit (INDEPENDENT_AMBULATORY_CARE_PROVIDER_SITE_OTHER): Payer: Commercial Managed Care - PPO | Admitting: Family Medicine

## 2022-01-11 VITALS — BP 124/80 | HR 73 | Temp 98.4°F | Wt 212.8 lb

## 2022-01-11 DIAGNOSIS — E1169 Type 2 diabetes mellitus with other specified complication: Secondary | ICD-10-CM

## 2022-01-11 DIAGNOSIS — G4733 Obstructive sleep apnea (adult) (pediatric): Secondary | ICD-10-CM

## 2022-01-11 DIAGNOSIS — Z23 Encounter for immunization: Secondary | ICD-10-CM | POA: Diagnosis not present

## 2022-01-11 DIAGNOSIS — F419 Anxiety disorder, unspecified: Secondary | ICD-10-CM | POA: Diagnosis not present

## 2022-01-11 DIAGNOSIS — I10 Essential (primary) hypertension: Secondary | ICD-10-CM | POA: Diagnosis not present

## 2022-01-11 DIAGNOSIS — E109 Type 1 diabetes mellitus without complications: Secondary | ICD-10-CM

## 2022-01-11 DIAGNOSIS — F5105 Insomnia due to other mental disorder: Secondary | ICD-10-CM

## 2022-01-11 DIAGNOSIS — E291 Testicular hypofunction: Secondary | ICD-10-CM

## 2022-01-11 DIAGNOSIS — E785 Hyperlipidemia, unspecified: Secondary | ICD-10-CM

## 2022-01-11 DIAGNOSIS — J301 Allergic rhinitis due to pollen: Secondary | ICD-10-CM

## 2022-01-11 DIAGNOSIS — E118 Type 2 diabetes mellitus with unspecified complications: Secondary | ICD-10-CM | POA: Diagnosis not present

## 2022-01-11 LAB — POCT GLYCOSYLATED HEMOGLOBIN (HGB A1C): Hemoglobin A1C: 6.1 % — AB (ref 4.0–5.6)

## 2022-01-11 MED ORDER — OZEMPIC (1 MG/DOSE) 4 MG/3ML ~~LOC~~ SOPN: PEN_INJECTOR | SUBCUTANEOUS | 0 refills | Status: DC

## 2022-01-11 NOTE — Progress Notes (Signed)
Subjective:    Patient ID: Taylor Powell, male    DOB: [birth year], 61 y.o.   MRN: [id number]  Taylor Powell is a 61 y.o. male who presents for follow-up of Type 2 diabetes mellitus.  Patient is checking home blood sugars.   Home blood sugar records: not sure  How often is blood sugars being checked: on the weekend  Current symptoms/problems include none and have been stable. Daily foot checks: Yes    Any foot concerns: No concern Last eye exam: Dr Hyacinth Meeker last april Exercise: Home exercise routine includes 30 minutes to 1 hr. . His life has been busy with work as well as home issues and the fact that his mother is now in assisted living which has been good.  He is getting help with some psychological issues with counseling to learn better work life balance.  He now recognizes that food to certain extent has become his addiction and that he has been avoiding exercise due to various other factors.  Presently he is not taking testosterone and has had no libido issues.  He also is not using his CPAP. The following portions of the patient's history were reviewed and updated as appropriate: allergies, current medications, past medical history, past social history and problem list.  He states that he is feeling better and sleeping better.  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  ROS as in subjective above.     Objective:    Physical Exam Alert and in no distress otherwise not examined.  Hemoglobin A1c is 6.1  Blood pressure 124/80, pulse 73, temperature 98.4 F (36.9 C), weight 212 lb 12.8 oz (96.5 kg).  Lab Review    Latest Ref Rng & Units 06/23/2021    4:45 PM 02/23/2021   12:08 PM 07/04/2017    4:21 PM 07/04/2017    3:53 PM 01/20/2016    9:07 PM  Diabetic Labs  HbA1c 4.0 - 5.6 % 6.5  6.9  5.7     Chol 100 - 199 mg/dL  676   195    HDL >09 mg/dL  46   50    Calc LDL 0 - 99 mg/dL  326   712    Triglycerides 0 - 149 mg/dL   458   099    Creatinine 0.76 - 1.27 mg/dL  8.33   8.25  0.53       01/11/2022   11:14 AM 07/17/2021    3:43 PM 06/23/2021    1:22 PM 04/06/2021    2:14 PM 03/23/2021    3:06 PM  BP/Weight  Systolic BP 124 138 138 132   Diastolic BP 80 88 88 82   Wt. (Lbs) 212.8 212 219.8 200 200  BMI 29.68 kg/m2 29.57 kg/m2 30.66 kg/m2 27.89 kg/m2 27.89 kg/m2       No data to display          Taylor Powell  reports that he has never smoked. He has never used smokeless tobacco. He reports current alcohol use of about 6.0 standard drinks of alcohol per week. He reports that he does not use drugs.     Assessment & Plan:    Controlled type 2 diabetes mellitus with complication, without long-term current use of insulin (HCC) - Plan: CBC with Differential/Platelet, Comprehensive metabolic panel, Lipid panel, POCT glycosylated hemoglobin (Hb A1C)  Essential hypertension  Hypogonadism male  Insomnia secondary to anxiety - Plan: Testosterone  OSA (  obstructive sleep apnea)  Hyperlipidemia associated with type 2 diabetes mellitus (HCC) - Plan: Lipid panel  Need for Tdap vaccination - Plan: Tdap vaccine greater than or equal to 7yo IM  Need for influenza vaccination - Plan: Flu Vaccine QUAD 6+ mos PF IM (Fluarix Quad PF)  New onset of diabetes mellitus in pediatric patient (HCC) - Plan: Semaglutide, 1 MG/DOSE, (OZEMPIC, 1 MG/DOSE,) 4 MG/3ML SOPN  Encouraged him to continue with counseling to help deal better with his eating habits as well as get back into a regular exercise program.  He recognizes that he has a work life issue that is out of balance.  He plans to start doing consulting.  Discussed the fact Unilet he is making these positive changes it is very disruptive.  Recheck here in about 4 months.

## 2022-01-12 LAB — CBC WITH DIFFERENTIAL/PLATELET
Basophils Absolute: 0 10*3/uL (ref 0.0–0.2)
Basos: 0 %
EOS (ABSOLUTE): 0.1 10*3/uL (ref 0.0–0.4)
Eos: 2 %
Hematocrit: 49.1 % (ref 37.5–51.0)
Hemoglobin: 16.9 g/dL (ref 13.0–17.7)
Immature Grans (Abs): 0 10*3/uL (ref 0.0–0.1)
Immature Granulocytes: 0 %
Lymphocytes Absolute: 2.9 10*3/uL (ref 0.7–3.1)
Lymphs: 42 %
MCH: 32.4 pg (ref 26.6–33.0)
MCHC: 34.4 g/dL (ref 31.5–35.7)
MCV: 94 fL (ref 79–97)
Monocytes Absolute: 0.6 10*3/uL (ref 0.1–0.9)
Monocytes: 9 %
Neutrophils Absolute: 3.1 10*3/uL (ref 1.4–7.0)
Neutrophils: 47 %
Platelets: 284 10*3/uL (ref 150–450)
RBC: 5.22 x10E6/uL (ref 4.14–5.80)
RDW: 12.4 % (ref 11.6–15.4)
WBC: 6.7 10*3/uL (ref 3.4–10.8)

## 2022-01-12 LAB — LIPID PANEL
Chol/HDL Ratio: 5.4 ratio — ABNORMAL HIGH (ref 0.0–5.0)
Cholesterol, Total: 273 mg/dL — ABNORMAL HIGH (ref 100–199)
HDL: 51 mg/dL (ref >39)
LDL Chol Calc (NIH): 169 mg/dL — ABNORMAL HIGH (ref 0–99)
Triglycerides: 283 mg/dL — ABNORMAL HIGH (ref 0–149)
VLDL Cholesterol Cal: 53 mg/dL — ABNORMAL HIGH (ref 5–40)

## 2022-01-12 LAB — COMPREHENSIVE METABOLIC PANEL
ALT: 67 IU/L — ABNORMAL HIGH (ref 0–44)
AST: 34 IU/L (ref 0–40)
Albumin/Globulin Ratio: 2.1 (ref 1.2–2.2)
Albumin: 4.8 g/dL (ref 3.8–4.9)
Alkaline Phosphatase: 71 IU/L (ref 44–121)
BUN/Creatinine Ratio: 14 (ref 10–24)
BUN: 15 mg/dL (ref 8–27)
Bilirubin Total: 0.4 mg/dL (ref 0.0–1.2)
CO2: 21 mmol/L (ref 20–29)
Calcium: 10.3 mg/dL — ABNORMAL HIGH (ref 8.6–10.2)
Chloride: 99 mmol/L (ref 96–106)
Creatinine, Ser: 1.09 mg/dL (ref 0.76–1.27)
Globulin, Total: 2.3 g/dL (ref 1.5–4.5)
Glucose: 89 mg/dL (ref 70–99)
Potassium: 4.4 mmol/L (ref 3.5–5.2)
Sodium: 140 mmol/L (ref 134–144)
Total Protein: 7.1 g/dL (ref 6.0–8.5)
eGFR: 78 mL/min/{1.73_m2} (ref >59)

## 2022-01-12 LAB — TESTOSTERONE: Testosterone: 231 ng/dL — ABNORMAL LOW (ref 264–916)

## 2022-02-16 ENCOUNTER — Telehealth: Payer: Self-pay | Admitting: Internal Medicine

## 2022-02-16 DIAGNOSIS — E109 Type 1 diabetes mellitus without complications: Secondary | ICD-10-CM

## 2022-02-16 MED ORDER — OZEMPIC (1 MG/DOSE) 4 MG/3ML ~~LOC~~ SOPN: PEN_INJECTOR | SUBCUTANEOUS | 0 refills | Status: DC

## 2022-02-16 NOTE — Telephone Encounter (Signed)
Pt needs a refill to Ozempic. Sent to pharmacy

## 2022-03-02 ENCOUNTER — Encounter: Payer: Commercial Managed Care - PPO | Admitting: Family Medicine

## 2022-03-15 ENCOUNTER — Other Ambulatory Visit: Payer: Self-pay | Admitting: Family Medicine

## 2022-03-15 DIAGNOSIS — E109 Type 1 diabetes mellitus without complications: Secondary | ICD-10-CM

## 2022-03-15 DIAGNOSIS — G4733 Obstructive sleep apnea (adult) (pediatric): Secondary | ICD-10-CM

## 2022-03-15 MED ORDER — TRAZODONE HCL 50 MG PO TABS: ORAL_TABLET | ORAL | 3 refills | Status: DC

## 2022-03-15 MED ORDER — OZEMPIC (1 MG/DOSE) 4 MG/3ML ~~LOC~~ SOPN: PEN_INJECTOR | SUBCUTANEOUS | 0 refills | Status: DC

## 2022-04-12 ENCOUNTER — Other Ambulatory Visit: Payer: Self-pay | Admitting: Family Medicine

## 2022-04-12 DIAGNOSIS — E109 Type 1 diabetes mellitus without complications: Secondary | ICD-10-CM

## 2022-05-12 ENCOUNTER — Other Ambulatory Visit: Payer: Self-pay

## 2022-05-12 ENCOUNTER — Encounter: Payer: Self-pay | Admitting: Family Medicine

## 2022-05-12 ENCOUNTER — Ambulatory Visit (INDEPENDENT_AMBULATORY_CARE_PROVIDER_SITE_OTHER): Payer: Commercial Managed Care - PPO | Admitting: Family Medicine

## 2022-05-12 ENCOUNTER — Other Ambulatory Visit: Payer: Self-pay | Admitting: Family Medicine

## 2022-05-12 VITALS — BP 128/78 | HR 78 | Wt 212.4 lb

## 2022-05-12 DIAGNOSIS — E109 Type 1 diabetes mellitus without complications: Secondary | ICD-10-CM

## 2022-05-12 DIAGNOSIS — E119 Type 2 diabetes mellitus without complications: Secondary | ICD-10-CM | POA: Diagnosis not present

## 2022-05-12 DIAGNOSIS — E785 Hyperlipidemia, unspecified: Secondary | ICD-10-CM

## 2022-05-12 DIAGNOSIS — G4733 Obstructive sleep apnea (adult) (pediatric): Secondary | ICD-10-CM | POA: Diagnosis not present

## 2022-05-12 DIAGNOSIS — E1169 Type 2 diabetes mellitus with other specified complication: Secondary | ICD-10-CM

## 2022-05-12 DIAGNOSIS — E291 Testicular hypofunction: Secondary | ICD-10-CM | POA: Diagnosis not present

## 2022-05-12 LAB — POCT GLYCOSYLATED HEMOGLOBIN (HGB A1C): Hemoglobin A1C: 6.2 % — AB (ref 4.0–5.6)

## 2022-05-12 MED ORDER — OZEMPIC (1 MG/DOSE) 4 MG/3ML ~~LOC~~ SOPN: PEN_INJECTOR | SUBCUTANEOUS | 0 refills | Status: DC

## 2022-05-12 MED ORDER — SEMAGLUTIDE (2 MG/DOSE) 8 MG/3ML ~~LOC~~ SOPN: 2.0000 mg | PEN_INJECTOR | SUBCUTANEOUS | 5 refills | Status: DC

## 2022-05-12 NOTE — Progress Notes (Signed)
Subjective:    Patient ID: Taylor Powell, male    DOB: 06-24-1960, 62 y.o.   MRN: [id number]  Taylor Powell is a 62 y.o. male who presents for follow-up of Type 2 diabetes mellitus.  Patient is checking home blood sugars.   Home blood sugar records: BGs range between 160 and 140 How often is blood sugars being checked: once a week Current symptoms/problems include none and have been stable. Daily foot checks: Yes   Any foot concerns: no Last eye exam: unknown He is in the process of job change and taking a sabbatical.  He plans to get involved in a program called [employer] weight loss.  He realized that his lifestyle was adversely affecting his health and plans to make major lifestyle changes to have better balance in his life.  Presently he is not taking a statin stating the statin because myalgias or testosterone.  He does continue on Ozempic.  The following portions of the patient's history were reviewed and updated as appropriate: allergies, current medications, past medical history, past social history and problem list.  ROS as in subjective above.     Objective:    Physical Exam Alert and in no distress otherwise not examined. Hemoglobin A1c is 6.2 Blood pressure 128/78, pulse 78, weight 212 lb 6.4 oz (96.3 kg).  Lab Review    Latest Ref Rng & Units 05/12/2022    8:31 AM 01/11/2022   12:50 PM 01/11/2022   12:10 PM 06/23/2021    4:45 PM 02/23/2021   12:08 PM  Diabetic Labs  HbA1c 4.0 - 5.6 % 6.2  6.1   6.5  6.9   Chol 100 - 199 mg/dL   273   276   HDL >39 mg/dL   51   46   Calc LDL 0 - 99 mg/dL   169   160   Triglycerides 0 - 149 mg/dL   283   369   Creatinine 0.76 - 1.27 mg/dL   1.09   0.96       05/12/2022    8:08 AM 01/11/2022   11:14 AM 07/17/2021    3:43 PM 06/23/2021    1:22 PM 04/06/2021    2:14 PM  BP/Weight  Systolic BP 0000000 [id number] 0000000 0000000 [id number]  Diastolic BP 78 80 88 88 82  Wt. (Lbs) 212.4 212.8 212 219.8 200  BMI 29.62 kg/m2 29.68 kg/m2 29.57 kg/m2 30.66 kg/m2 27.89 kg/m2        No data to display          Taylor Powell  reports that he has never smoked. He has never used smokeless tobacco. He reports current alcohol use of about 6.0 standard drinks of alcohol per week. He reports that he does not use drugs.     Assessment & Plan:    Type 2 diabetes mellitus without complication, without long-term current use of insulin (HCC) - Plan: POCT glycosylated hemoglobin (Hb A1C)  OSA (obstructive sleep apnea)  Hyperlipidemia associated with type 2 diabetes mellitus ([place]) - Plan: Lipid panel  Hypogonadism male I complemented him on working to make major lifestyle changes to have more balance in his life specifically making lifestyle changes to improve his health.  Discussed Mediterranean diet with him.  Also discussed the possibility of doing another sleep study at some point in the future to eliminate the OSA from his regimen.  This is apparently caused some difficulties with getting long-term insurance.  Recheck here in 4 months with blood work including testosterone  to see if weight loss can also affect that value.

## 2022-05-13 ENCOUNTER — Ambulatory Visit: Payer: Commercial Managed Care - PPO | Admitting: Family Medicine

## 2022-06-10 ENCOUNTER — Telehealth: Payer: Self-pay | Admitting: Family Medicine

## 2022-06-10 NOTE — Telephone Encounter (Signed)
P.A. OZEMPIC completed, called insurance company today because pt needs ASAP going out of town & they need office notes, A1c & med list faxed.  I faxed to [employer] ins t# [phone] Urgent request

## 2022-06-10 NOTE — Telephone Encounter (Signed)
Per Pt he called ins & was approved, called [place] [facility] & transferred & went thru for $25

## 2022-08-31 ENCOUNTER — Encounter: Payer: Self-pay | Admitting: Family Medicine

## 2022-08-31 MED ORDER — SEMAGLUTIDE (2 MG/DOSE) 8 MG/3ML ~~LOC~~ SOPN: 2.0000 mg | PEN_INJECTOR | SUBCUTANEOUS | 5 refills | Status: DC

## 2022-09-08 ENCOUNTER — Ambulatory Visit: Payer: Commercial Managed Care - PPO | Admitting: Family Medicine

## 2022-09-24 ENCOUNTER — Telehealth: Payer: Self-pay | Admitting: Family Medicine

## 2022-09-24 NOTE — Telephone Encounter (Signed)
Recv'd P.A. OZEMPIC pt just needed it early because he was going out of town, so I called pharmacy & it went thru without P.A.

## 2022-11-03 ENCOUNTER — Ambulatory Visit: Payer: Commercial Managed Care - PPO | Admitting: Family Medicine

## 2022-12-01 ENCOUNTER — Telehealth: Payer: Self-pay | Admitting: Family Medicine

## 2022-12-01 NOTE — Telephone Encounter (Signed)
P.A. OZEMPIC sent in yesterday by Asha, Pt called regarding P.A.  please call when you get response

## 2022-12-02 ENCOUNTER — Ambulatory Visit: Payer: Commercial Managed Care - PPO | Admitting: Family Medicine

## 2022-12-06 NOTE — Telephone Encounter (Signed)
Pt notified via phone. Picked up rx a few days ago.

## 2022-12-06 NOTE — Telephone Encounter (Signed)
[facility] Rx #: [id number] Drug: Ozempic (2 MG/DOSE) 8MG Ronny Bacon pen-injectors Form: [facility] General Form  Determination: Favorable

## 2022-12-08 DIAGNOSIS — E785 Hyperlipidemia, unspecified: Secondary | ICD-10-CM | POA: Insufficient documentation

## 2022-12-08 DIAGNOSIS — E119 Type 2 diabetes mellitus without complications: Secondary | ICD-10-CM | POA: Insufficient documentation

## 2022-12-09 ENCOUNTER — Telehealth: Payer: Self-pay | Admitting: Family Medicine

## 2022-12-09 ENCOUNTER — Ambulatory Visit: Payer: No Typology Code available for payment source | Admitting: Family Medicine

## 2022-12-09 VITALS — BP 110/72 | Wt 206.8 lb

## 2022-12-09 DIAGNOSIS — E119 Type 2 diabetes mellitus without complications: Secondary | ICD-10-CM | POA: Diagnosis not present

## 2022-12-09 DIAGNOSIS — E291 Testicular hypofunction: Secondary | ICD-10-CM | POA: Diagnosis not present

## 2022-12-09 DIAGNOSIS — E785 Hyperlipidemia, unspecified: Secondary | ICD-10-CM

## 2022-12-09 DIAGNOSIS — I152 Hypertension secondary to endocrine disorders: Secondary | ICD-10-CM

## 2022-12-09 DIAGNOSIS — E1169 Type 2 diabetes mellitus with other specified complication: Secondary | ICD-10-CM | POA: Diagnosis not present

## 2022-12-09 DIAGNOSIS — Z23 Encounter for immunization: Secondary | ICD-10-CM | POA: Diagnosis not present

## 2022-12-09 DIAGNOSIS — E1159 Type 2 diabetes mellitus with other circulatory complications: Secondary | ICD-10-CM | POA: Diagnosis not present

## 2022-12-09 LAB — POCT GLYCOSYLATED HEMOGLOBIN (HGB A1C): Hemoglobin A1C: 5.6 % (ref 4.0–5.6)

## 2022-12-09 MED ORDER — ATORVASTATIN CALCIUM 20 MG PO TABS
20.0000 mg | ORAL_TABLET | Freq: Every day | ORAL | 3 refills | Status: DC
Start: 2022-12-09 — End: 2023-06-06

## 2022-12-09 MED ORDER — SEMAGLUTIDE (2 MG/DOSE) 8 MG/3ML ~~LOC~~ SOPN: 2.0000 mg | PEN_INJECTOR | SUBCUTANEOUS | 1 refills | Status: DC

## 2022-12-09 NOTE — Telephone Encounter (Signed)
He said he discussed this with you and  nurse when he was here. He was having issues with the medication and has not been taking in very long time and prefers to lower without meds

## 2022-12-09 NOTE — Progress Notes (Signed)
  Subjective:    Patient ID: Taylor Powell, male    DOB: [birth year], 62 y.o.   MRN: [id number]  Hunberto Mangat is a 61 y.o. male who presents for follow-up of Type 2 diabetes mellitus.  Home blood sugar records:  checking sugars   usually twice per week. Current symptoms/problems include none and have been improving. Daily foot checks: none   Any foot concerns: none How often blood sugars checked: couple times a week Exercise:  home gym and walking dog Diet: no diet He continues on Ozempic 2 mg and is doing fine on that.  He has not been taking testosterone in the last several months.  He does note fatigue as well as decreased libido.  He is interested in getting back on a testosterone replacement if appropriate and would like injections as he has had difficulty doing the topical on a regular basis.  Recently his mother died and he is in the process of getting all those things taken care of.  He also fractured his right ankle in August but seems to have recovered fairly nicely from that.  The following portions of the patient's history were reviewed and updated as appropriate: allergies, current medications, past medical history, past social history and problem list.  ROS as in subjective above.     Objective:    Physical Exam Alert and in no distress. Tympanic membranes and canals are normal. Pharyngeal area is normal. Neck is supple without adenopathy or thyromegaly. Cardiac exam shows a regular sinus rhythm without murmurs or gallops. Lungs are clear to auscultation. Hemoglobin A1c is 5.6  Lab Review    Latest Ref Rng & Units 05/12/2022    8:31 AM 01/11/2022   12:50 PM 01/11/2022   12:10 PM 06/23/2021    4:45 PM 02/23/2021   12:08 PM  Diabetic Labs  HbA1c 4.0 - 5.6 % 6.2  6.1   6.5  6.9   Chol 100 - 199 mg/dL   409   811   HDL >91 mg/dL   51   46   Calc LDL 0 - 99 mg/dL   478   295   Triglycerides 0 - 149 mg/dL   621   308   Creatinine 0.76 - 1.27 mg/dL   6.57   8.46        9/62/9528    8:08 AM 01/11/2022   11:14 AM 07/17/2021    3:43 PM 06/23/2021    1:22 PM 04/06/2021    2:14 PM  BP/Weight  Systolic BP 128 124 138 138 132  Diastolic BP 78 80 88 88 82  Wt. (Lbs) 212.4 212.8 212 219.8 200  BMI 29.62 kg/m2 29.68 kg/m2 29.57 kg/m2 30.66 kg/m2 27.89 kg/m2       No data to display          Rommie  reports that he has never smoked. He has never used smokeless tobacco. He reports current alcohol use of about 6.0 standard drinks of alcohol per week. He reports that he does not use drugs.     Assessment & Plan:    Type 2 diabetes mellitus without complication, without long-term current use of insulin (HCC)  Hyperlipidemia associated with type 2 diabetes mellitus (HCC)  Hypogonadism male  Hypertension associated with diabetes (HCC)  Need for vaccination against Streptococcus pneumoniae  Need for influenza vaccination The A1c is quite good and I will therefore see him in about 6 months.  He is comfortable with that.

## 2022-12-09 NOTE — Telephone Encounter (Signed)
Pt called re lipitor that was sent in. He states he is not taking and has not been taking and Wants to make sure that is corrected on his record

## 2022-12-10 ENCOUNTER — Encounter: Payer: Self-pay | Admitting: Family Medicine

## 2022-12-10 LAB — COMPREHENSIVE METABOLIC PANEL
ALT: 64 [IU]/L — ABNORMAL HIGH (ref 0–44)
AST: 33 [IU]/L (ref 0–40)
Albumin: 4.8 g/dL (ref 3.9–4.9)
Alkaline Phosphatase: 73 [IU]/L (ref 44–121)
BUN/Creatinine Ratio: 15 (ref 10–24)
BUN: 15 mg/dL (ref 8–27)
Bilirubin Total: 0.3 mg/dL (ref 0.0–1.2)
CO2: 22 mmol/L (ref 20–29)
Calcium: 9.6 mg/dL (ref 8.6–10.2)
Chloride: 103 mmol/L (ref 96–106)
Creatinine, Ser: 0.99 mg/dL (ref 0.76–1.27)
Globulin, Total: 2.3 g/dL (ref 1.5–4.5)
Glucose: 96 mg/dL (ref 70–99)
Potassium: 4.7 mmol/L (ref 3.5–5.2)
Sodium: 140 mmol/L (ref 134–144)
Total Protein: 7.1 g/dL (ref 6.0–8.5)
eGFR: 87 mL/min/{1.73_m2} (ref >59)

## 2022-12-10 LAB — CBC WITH DIFFERENTIAL/PLATELET
Basophils Absolute: 0 10*3/uL (ref 0.0–0.2)
Basos: 1 %
EOS (ABSOLUTE): 0.2 10*3/uL (ref 0.0–0.4)
Eos: 3 %
Hematocrit: 50.1 % (ref 37.5–51.0)
Hemoglobin: 16.5 g/dL (ref 13.0–17.7)
Immature Grans (Abs): 0 10*3/uL (ref 0.0–0.1)
Immature Granulocytes: 1 %
Lymphocytes Absolute: 3 10*3/uL (ref 0.7–3.1)
Lymphs: 47 %
MCH: 32.1 pg (ref 26.6–33.0)
MCHC: 32.9 g/dL (ref 31.5–35.7)
MCV: 98 fL — ABNORMAL HIGH (ref 79–97)
Monocytes Absolute: 0.5 10*3/uL (ref 0.1–0.9)
Monocytes: 8 %
Neutrophils Absolute: 2.5 10*3/uL (ref 1.4–7.0)
Neutrophils: 40 %
Platelets: 302 10*3/uL (ref 150–450)
RBC: 5.14 x10E6/uL (ref 4.14–5.80)
RDW: 12.2 % (ref 11.6–15.4)
WBC: 6.2 10*3/uL (ref 3.4–10.8)

## 2022-12-10 LAB — LIPID PANEL
Chol/HDL Ratio: 4.9 ratio (ref 0.0–5.0)
Cholesterol, Total: 238 mg/dL — ABNORMAL HIGH (ref 100–199)
HDL: 49 mg/dL (ref >39)
LDL Chol Calc (NIH): 154 mg/dL — ABNORMAL HIGH (ref 0–99)
Triglycerides: 192 mg/dL — ABNORMAL HIGH (ref 0–149)
VLDL Cholesterol Cal: 35 mg/dL (ref 5–40)

## 2022-12-10 LAB — TESTOSTERONE: Testosterone: 249 ng/dL — ABNORMAL LOW (ref 264–916)

## 2022-12-13 MED ORDER — TESTOSTERONE CYPIONATE 200 MG/ML IM KIT: 200.0000 mg | PACK | INTRAMUSCULAR | 1 refills | Status: DC

## 2022-12-14 ENCOUNTER — Other Ambulatory Visit: Payer: Self-pay | Admitting: *Deleted

## 2022-12-14 DIAGNOSIS — E291 Testicular hypofunction: Secondary | ICD-10-CM

## 2022-12-14 MED ORDER — NEEDLE (DISP) 22G X 1" MISC: 1.0000 | 0 refills | Status: DC

## 2022-12-14 MED ORDER — SYRINGE 22G X 1" 3 ML MISC: 1.0000 | 0 refills | Status: DC

## 2022-12-15 ENCOUNTER — Telehealth: Payer: Self-pay | Admitting: Family Medicine

## 2022-12-15 MED ORDER — TESTOSTERONE CYPIONATE 200 MG/ML IM SOLN
200.0000 mg | Freq: Once | INTRAMUSCULAR | 2 refills | Status: DC
Start: 2022-12-15 — End: 2022-12-24

## 2022-12-15 NOTE — Telephone Encounter (Signed)
Pt would like a nurse to give him a call about his testosterone prescription. He says the pharmacy only gave him a single dose to take until his next refill and not enough to take every 14 days. He was at the pharmacy when I spoke with him so I had him give it back until it is corrected.

## 2022-12-24 MED ORDER — TESTOSTERONE CYPIONATE 200 MG/ML IM SOLN: 200.0000 mg | INTRAMUSCULAR | 2 refills | Status: DC

## 2022-12-24 NOTE — Addendum Note (Signed)
Addended by: Ronnald Nian on: 12/24/2022 10:56 AM   Modules accepted: Orders

## 2023-04-12 ENCOUNTER — Encounter: Payer: Self-pay | Admitting: Internal Medicine

## 2023-05-09 ENCOUNTER — Telehealth: Payer: Self-pay | Admitting: Family Medicine

## 2023-05-09 DIAGNOSIS — E119 Type 2 diabetes mellitus without complications: Secondary | ICD-10-CM

## 2023-05-09 NOTE — Telephone Encounter (Signed)
 Pt called and is requesting a refill on his ozempic please send to the [id number] - [place], [place] - [address] AT [facility] [address]

## 2023-05-10 MED ORDER — SEMAGLUTIDE (2 MG/DOSE) 8 MG/3ML ~~LOC~~ SOPN: 2.0000 mg | PEN_INJECTOR | SUBCUTANEOUS | 1 refills | Status: DC

## 2023-05-26 ENCOUNTER — Encounter: Payer: Self-pay | Admitting: Family Medicine

## 2023-06-06 ENCOUNTER — Ambulatory Visit (INDEPENDENT_AMBULATORY_CARE_PROVIDER_SITE_OTHER): Admitting: Family Medicine

## 2023-06-06 ENCOUNTER — Encounter: Payer: Self-pay | Admitting: Family Medicine

## 2023-06-06 VITALS — BP 115/80 | HR 97 | Wt 206.8 lb

## 2023-06-06 DIAGNOSIS — E119 Type 2 diabetes mellitus without complications: Secondary | ICD-10-CM

## 2023-06-06 DIAGNOSIS — E1169 Type 2 diabetes mellitus with other specified complication: Secondary | ICD-10-CM | POA: Diagnosis not present

## 2023-06-06 DIAGNOSIS — E291 Testicular hypofunction: Secondary | ICD-10-CM | POA: Diagnosis not present

## 2023-06-06 DIAGNOSIS — E785 Hyperlipidemia, unspecified: Secondary | ICD-10-CM

## 2023-06-06 DIAGNOSIS — E1159 Type 2 diabetes mellitus with other circulatory complications: Secondary | ICD-10-CM

## 2023-06-06 DIAGNOSIS — Z79899 Other long term (current) drug therapy: Secondary | ICD-10-CM

## 2023-06-06 DIAGNOSIS — I152 Hypertension secondary to endocrine disorders: Secondary | ICD-10-CM

## 2023-06-06 LAB — POCT GLYCOSYLATED HEMOGLOBIN (HGB A1C): Hemoglobin A1C: 5.8 % — AB (ref 4.0–5.6)

## 2023-06-06 MED ORDER — ROSUVASTATIN CALCIUM 20 MG PO TABS: 20.0000 mg | ORAL_TABLET | Freq: Every day | ORAL | 3 refills | Status: DC

## 2023-06-06 MED ORDER — LOSARTAN POTASSIUM 50 MG PO TABS: 50.0000 mg | ORAL_TABLET | Freq: Every day | ORAL | 3 refills | Status: DC

## 2023-06-06 NOTE — Progress Notes (Signed)
 Subjective:    Patient ID: Taylor Powell, male    DOB: Mar 15, 1960, 63 y.o.   MRN: [id number]  Taylor Powell is a 63 y.o. male who presents for follow-up of Type 2 diabetes mellitus.  Home blood sugar records:  normal Current symptoms/problems include none and have been improving. Daily foot checks: no   Any foot concerns: no How often blood sugars checked: twice a week more on weekends.  Exercise: regular Diet:regular He continues to pick and having no difficulty with that.  He is now taking his testosterone  on a weekly basis on half the original dosing.  He does have underlying allergies and is on a nasal spray that contains multiple compounds that I did review and all of them seem to be appropriate except for a small dose of oxymetazoline but he seems to be tolerating this well and states that his breathing and sleep is doing quite nicely.  He also uses Zyrtec.  He is not taking a statin drug and is presently not on an ACE inhibitor.  He apparently is considering seeing an ENT.  He had his dentist do some studies this did show nasal deviation as well as some mucosal thickening. The following portions of the patient's history were reviewed and updated as appropriate: allergies, current medications, past medical history, past social history and problem list.  ROS as in subjective above.     Objective:    Physical Exam Alert and in no distress otherwise not examined.  Hemoglobin A1c is 5.8   Lab Review    Latest Ref Rng & Units 06/06/2023    9:44 AM 12/09/2022    9:02 AM 12/09/2022    8:25 AM 05/12/2022    8:31 AM 01/11/2022   12:50 PM  Diabetic Labs  HbA1c 4.0 - 5.6 % 5.8   5.6  6.2  6.1   Chol 100 - 199 mg/dL  440      HDL >10 mg/dL  49      Calc LDL 0 - 99 mg/dL  272      Triglycerides 0 - 149 mg/dL  536      Creatinine 6.44 - 1.27 mg/dL  0.34          7/42/5956    9:03 AM 12/09/2022    8:13 AM 05/12/2022    8:08 AM 01/11/2022   11:14 AM 07/17/2021    3:43 PM  BP/Weight   Systolic BP 115 110 128 124 138  Diastolic BP 80 72 78 80 88  Wt. (Lbs) 206.8 206.8 212.4 212.8 212  BMI 28.84 kg/m2 28.84 kg/m2 29.62 kg/m2 29.68 kg/m2 29.57 kg/m2       No data to display          [name]  reports that he has never smoked. He has never used smokeless tobacco. He reports current alcohol use of about 6.0 standard drinks of alcohol per week. He reports that he does not use drugs.     Assessment & Plan:    Type 2 diabetes mellitus without complication, without long-term current use of insulin (HCC) - Plan: POCT glycosylated hemoglobin (Hb A1C), losartan  (COZAAR ) 50 MG tablet  Hypogonadism male - Plan: Testosterone , CBC with Differential/Platelet  Hyperlipidemia associated with type 2 diabetes mellitus (HCC) - Plan: rosuvastatin  (CRESTOR ) 20 MG tablet, Lipid panel  Hypertension associated with diabetes (HCC) - Plan: losartan  (COZAAR ) 50 MG tablet  Encounter for long-term (current) use of medications  I discussed referral to ENT and reviewed the CT scan was  on his phone.  I explained that even though he has got a deviation he does not seem to be having trouble breathing out of 1 side as opposed to the other and he states that the medicine that he is using is helping open his airways.  Explained that if we did anything I recommend doing a sleep study again to see how much better he is doing.  Continue on his nasal spray.  Discussed placing him on a different statin as he did have difficulty with Lipitor causing myalgias.  He will keep informed concerning that.  Also discussed putting him on a low-dose ARB for kidney preservation.  Complete exam here in 6 months.

## 2023-06-07 ENCOUNTER — Encounter: Payer: Self-pay | Admitting: Family Medicine

## 2023-06-08 ENCOUNTER — Other Ambulatory Visit

## 2023-06-09 ENCOUNTER — Ambulatory Visit: Payer: No Typology Code available for payment source | Admitting: Family Medicine

## 2023-06-10 ENCOUNTER — Ambulatory Visit

## 2023-06-10 DIAGNOSIS — E785 Hyperlipidemia, unspecified: Secondary | ICD-10-CM

## 2023-06-10 DIAGNOSIS — E291 Testicular hypofunction: Secondary | ICD-10-CM

## 2023-06-11 LAB — CBC WITH DIFFERENTIAL/PLATELET
Basophils Absolute: 0.1 10*3/uL (ref 0.0–0.2)
Basos: 1 %
EOS (ABSOLUTE): 0.2 10*3/uL (ref 0.0–0.4)
Eos: 2 %
Hematocrit: 58.8 % — ABNORMAL HIGH (ref 37.5–51.0)
Hemoglobin: 19.6 g/dL — ABNORMAL HIGH (ref 13.0–17.7)
Immature Grans (Abs): 0.1 10*3/uL (ref 0.0–0.1)
Immature Granulocytes: 1 %
Lymphocytes Absolute: 3.1 10*3/uL (ref 0.7–3.1)
Lymphs: 39 %
MCH: 31.9 pg (ref 26.6–33.0)
MCHC: 33.3 g/dL (ref 31.5–35.7)
MCV: 96 fL (ref 79–97)
Monocytes Absolute: 0.7 10*3/uL (ref 0.1–0.9)
Monocytes: 9 %
Neutrophils Absolute: 3.8 10*3/uL (ref 1.4–7.0)
Neutrophils: 48 %
Platelets: 259 10*3/uL (ref 150–450)
RBC: 6.14 x10E6/uL — ABNORMAL HIGH (ref 4.14–5.80)
RDW: 13 % (ref 11.6–15.4)
WBC: 7.8 10*3/uL (ref 3.4–10.8)

## 2023-06-11 LAB — LIPID PANEL
Chol/HDL Ratio: 5 ratio (ref 0.0–5.0)
Cholesterol, Total: 226 mg/dL — ABNORMAL HIGH (ref 100–199)
HDL: 45 mg/dL (ref >39)
LDL Chol Calc (NIH): 147 mg/dL — ABNORMAL HIGH (ref 0–99)
Triglycerides: 187 mg/dL — ABNORMAL HIGH (ref 0–149)
VLDL Cholesterol Cal: 34 mg/dL (ref 5–40)

## 2023-06-11 LAB — TESTOSTERONE: Testosterone: 1234 ng/dL — ABNORMAL HIGH (ref 264–916)

## 2023-06-12 ENCOUNTER — Encounter: Payer: Self-pay | Admitting: Family Medicine

## 2023-07-15 ENCOUNTER — Encounter: Payer: Self-pay | Admitting: Family Medicine

## 2023-08-11 ENCOUNTER — Telehealth: Payer: Self-pay | Admitting: Family Medicine

## 2023-08-11 ENCOUNTER — Other Ambulatory Visit: Payer: Self-pay | Admitting: Family Medicine

## 2023-08-11 NOTE — Telephone Encounter (Signed)
 Pt having issues with getting Testosterone .  I called [facility] & ins will not pay for 10 ml & states the 10 ml bottle has to be discarded after 30 days anyway.  Ins will pay for 6 of the 1 ml bottles which is a 84 day supply for $0 co pay.  Called pt and informed

## 2023-09-11 ENCOUNTER — Other Ambulatory Visit: Payer: Self-pay | Admitting: Internal Medicine

## 2023-10-03 ENCOUNTER — Encounter: Payer: Self-pay | Admitting: Family Medicine

## 2023-10-03 ENCOUNTER — Ambulatory Visit: Admitting: Family Medicine

## 2023-10-03 VITALS — BP 128/88 | HR 80 | Wt 206.2 lb

## 2023-10-03 DIAGNOSIS — G43119 Migraine with aura, intractable, without status migrainosus: Secondary | ICD-10-CM | POA: Diagnosis not present

## 2023-10-03 DIAGNOSIS — E291 Testicular hypofunction: Secondary | ICD-10-CM | POA: Diagnosis not present

## 2023-10-03 DIAGNOSIS — G8929 Other chronic pain: Secondary | ICD-10-CM | POA: Diagnosis not present

## 2023-10-03 NOTE — Patient Instructions (Signed)
 Repatha, alternative to statin

## 2023-10-03 NOTE — Progress Notes (Signed)
 Name: Taylor Powell   Date of Visit: 10/03/23   Date of last visit with me: Visit date not found   CHIEF COMPLAINT:  Chief Complaint  Patient presents with   Acute Visit    8 day headache. Been going on for a couple of months. Hurts to touch temples. Causing like a aura in eye sight. Worse the headache gets, worse eye sight gets like looking at twinkle lights. Head feels like throbbing pain. Not getting any sleep. Can't functions. Has had an usual amount of stress and anxiety.        HPI:  Discussed the use of AI scribe software for clinical note transcription with the patient, who gave verbal consent to proceed.  History of Present Illness   Taylor Powell is a 63 year old male who presents with persistent headaches and visual disturbances.  He has been experiencing headaches for the past three months, initially noticing symptoms during a trip. The headaches are described as throbbing pain in the temples, often accompanied by visual disturbances such as halos or auras. These symptoms were intermittent during the trip and resolved spontaneously. Approximately four weeks ago, while working in the yard, he experienced a spot in the middle of his vision and throbbing temple pain, which he attributes to significant stress due to work-related issues.  In the past week, [place], he experienced headaches for seven consecutive days, with temple pain exacerbated by touch. He notes constant nasal drainage and eye running due to allergies, for which he started using a nasal spray called Allermy. He reports breathing better with the spray but is concerned about potential dependency. He has a history of chronic sinus issues and is prone to sinus infections.  He has been taking testosterone , initially at a double dose by mistake, which led to high testosterone  levels and elevated hematocrit and hemoglobin in recent labs. He has since corrected the dosage. He notes that his  headaches began around the time of the dosage error. He has tried over-the-counter medications like Aleve and ibuprofen for headache relief, but finds they require high doses to be effective. He finds relief in dark rooms and sleep.  He reports increased stress and difficulty managing it over the past few months, which he finds unusual for himself. 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         OBJECTIVE:       12/09/2022    8:09 AM  Depression screen PHQ 2/9  Decreased Interest 0  Down, Depressed, Hopeless 0  PHQ - 2 Score 0     BP Readings from Last 3 Encounters:  10/03/23 128/88  06/06/23 115/80  12/09/22 110/72    BP 128/88   Pulse 80   Wt 206 lb 3.2 oz (93.5 kg)   SpO2 96%   BMI 28.76 kg/m    Physical Exam   GENERAL: Slight erythema      Physical Exam Constitutional:      Appearance: Normal appearance.  Eyes:     Extraocular Movements: Extraocular movements intact.     Pupils: Pupils are equal, round, and reactive to light.     Comments: Conjuctival redenning   Cardiovascular:     Rate and Rhythm: Normal rate and regular rhythm.  Neurological:     General: No focal deficit present.  Mental Status: He is alert and oriented to person, place, and time. Mental status is at baseline.     ASSESSMENT/PLAN:   Assessment & Plan Chronic intractable headache, unspecified headache type  Intractable migraine with aura without status migrainosus  Hypogonadism male    Assessment and Plan    Migraine Intermittent headaches with aura, throbbing pain, and visual disturbances suggest migraines, possibly ocular. Stress and high hematocrit from testosterone  use may contribute. Avoid sumatriptan due to stroke risk with elevated hematocrit. - Administered Nurtec in office, provided  additional pill for home. - Administered Zofran for nausea. - Ordered CBC and testosterone  levels to assess hematocrit and hemoglobin. - Prescribe sumatriptan if blood work is normal.  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 Dosage adjusted. High hematocrit increases stroke risk, affecting migraine treatment. - Ordered CBC and testosterone  levels to reassess hematocrit and hemoglobin. - Evaluate testosterone  levels and adjust dosage if necessary.  Chronic sinusitis Chronic nasal drainage and sinus issues may contribute to headaches. Using Allermy nasal spray with improved breathing. Monitor for dependency due to decongestant. - Continue Allermy nasal spray as needed. - Monitor for signs of dependency or rebound congestion.  Hyperlipidemia High cholesterol with poor statin tolerance. Previous Crestor  20 mg caused adverse effects. Family history of high cholesterol. Discussed alternatives: Crestor  10 mg with ezetimibe and Repatha injections. Crestor  10 mg with ezetimibe offers similar reduction with fewer side effects. Repatha is a non-statin option, insurance coverage may vary. - Consider switching to Crestor  10 mg with ezetimibe if Crestor  20 mg is not tolerated. - Discuss Repatha injections as an alternative treatment option.         Brylin Stopper A. Vita MD [facility]

## 2023-10-04 ENCOUNTER — Ambulatory Visit: Payer: Self-pay | Admitting: Family Medicine

## 2023-10-04 DIAGNOSIS — G43109 Migraine with aura, not intractable, without status migrainosus: Secondary | ICD-10-CM

## 2023-10-04 DIAGNOSIS — R519 Headache, unspecified: Secondary | ICD-10-CM

## 2023-10-04 DIAGNOSIS — R718 Other abnormality of red blood cells: Secondary | ICD-10-CM

## 2023-10-04 MED ORDER — NURTEC 75 MG PO TBDP: 75.0000 mg | ORAL_TABLET | Freq: Every day | ORAL | 0 refills | Status: DC | PRN

## 2023-10-06 ENCOUNTER — Other Ambulatory Visit (HOSPITAL_COMMUNITY): Payer: Self-pay

## 2023-10-06 ENCOUNTER — Other Ambulatory Visit: Payer: Self-pay | Admitting: Family Medicine

## 2023-10-06 ENCOUNTER — Other Ambulatory Visit: Payer: Self-pay

## 2023-10-06 ENCOUNTER — Encounter: Payer: Self-pay | Admitting: Family Medicine

## 2023-10-06 ENCOUNTER — Ambulatory Visit: Payer: Self-pay

## 2023-10-06 DIAGNOSIS — G43109 Migraine with aura, not intractable, without status migrainosus: Secondary | ICD-10-CM

## 2023-10-06 MED ORDER — NURTEC 75 MG PO TBDP: 1.0000 | ORAL_TABLET | Freq: Every day | ORAL | 0 refills | Status: DC

## 2023-10-06 NOTE — Telephone Encounter (Signed)
  Patient declined triage and was just wanting his new rx for Nurtec to be corrected and sent back to pharmacy for pickup today.        Copied from CRM [id number]. Topic: Clinical - Red Word Triage >> Oct 06, 2023 11:57 AM Wess RAMAN wrote: Reason for CRM: Patient stated [facility] stated they needed clarification on what Rimegepant Sulfate (NURTEC) 75 MG TBDP is for. He also state he is in a lot of pain, so much he can't look at his phone nor think straight  Pharmacy: [id number] [place], [place] - [address] AT [facility] [address] [place] [address] Phone: [phone] Fax: [phone] Hours: Not open 24 hours Reason for Disposition  Similar to previously diagnosed migraine headaches  Answer Assessment - Initial Assessment Questions 1. LOCATION: Where does it hurt?      Hx migraine headaches, Declines triage  Protocols used: Headache-A-AH

## 2023-10-06 NOTE — Addendum Note (Signed)
 Addended by: JOYCE NORLEEN BROCKS on: 10/06/2023 12:35 PM   Modules accepted: Orders

## 2023-10-06 NOTE — Telephone Encounter (Signed)
 FYI Only or Action Required?: Action required by provider: states RX for this medication was not complete and pharmacy will not fill it.  Is asking that the rx be sent back over corrected.  Patient was last seen in primary care on 10/03/2023 by Vita Morrow, MD.  Called Nurse Triage reporting No chief complaint on file..  Symptoms began today.  Interventions attempted: Nothing.  Symptoms are: gradually worsening.  Triage Disposition: No disposition on file.  Patient/caregiver understands and will follow disposition?:

## 2023-10-07 ENCOUNTER — Telehealth: Payer: Self-pay

## 2023-10-07 ENCOUNTER — Other Ambulatory Visit (HOSPITAL_COMMUNITY): Payer: Self-pay

## 2023-10-07 LAB — TESTOSTERONE,FREE AND TOTAL
Testosterone, Free: 12.3 pg/mL (ref 6.6–18.1)
Testosterone: 619 ng/dL (ref 264–916)

## 2023-10-07 LAB — CBC WITH DIFFERENTIAL/PLATELET
Basophils Absolute: 0 x10E3/uL (ref 0.0–0.2)
Basos: 1 %
EOS (ABSOLUTE): 0.1 x10E3/uL (ref 0.0–0.4)
Eos: 1 %
Hematocrit: 59.3 % — ABNORMAL HIGH (ref 37.5–51.0)
Hemoglobin: 19.5 g/dL — ABNORMAL HIGH (ref 13.0–17.7)
Immature Grans (Abs): 0 x10E3/uL (ref 0.0–0.1)
Immature Granulocytes: 0 %
Lymphocytes Absolute: 2.4 x10E3/uL (ref 0.7–3.1)
Lymphs: 36 %
MCH: 32.8 pg (ref 26.6–33.0)
MCHC: 32.9 g/dL (ref 31.5–35.7)
MCV: 100 fL — ABNORMAL HIGH (ref 79–97)
Monocytes Absolute: 0.7 x10E3/uL (ref 0.1–0.9)
Monocytes: 11 %
Neutrophils Absolute: 3.5 x10E3/uL (ref 1.4–7.0)
Neutrophils: 50 %
Platelets: 269 x10E3/uL (ref 150–450)
RBC: 5.95 x10E6/uL — ABNORMAL HIGH (ref 4.14–5.80)
RDW: 13.1 % (ref 11.6–15.4)
WBC: 6.8 x10E3/uL (ref 3.4–10.8)

## 2023-10-07 MED ORDER — NURTEC 75 MG PO TBDP: 75.0000 mg | ORAL_TABLET | Freq: Every day | ORAL | 0 refills | Status: AC | PRN

## 2023-10-07 NOTE — Telephone Encounter (Signed)
 Pharmacy Patient Advocate Encounter   Received notification from Physician's Office that prior authorization for NURTEC 75MG  is required/requested.   Insurance verification completed.   The patient is insured through [facility] .   Per test claim: PA required; PA submitted to above mentioned insurance via Latent Key/confirmation #/[id number] [facility] Status is pending

## 2023-10-07 NOTE — Telephone Encounter (Signed)
 Don you have a PA for pts. Nurtec I did not see any notes about it in the system. Thanks.   Copied from CRM #[id number]. Topic: General - Other >> Oct 06, 2023  5:09 PM Santiya F wrote: Reason for CRM: Patient is calling in returning a call from [place]. Please follow up with patient.

## 2023-10-07 NOTE — Telephone Encounter (Signed)
 Pt. Wanting to know if he can pick up another sample of nurtec still has bad migraine and the script that was sent to the pharmacy requires a PA. Pt. Saw Dr. Vita for this recently just need ok from a provider before we could give him more samples.   Copied from CRM [id number]. Topic: General - Other >> Oct 06, 2023  5:28 PM Debby BROCKS wrote: Reason for CRM: Patient would like to know if he can pass by to pick up sample medications for his migraines. He states the med authorization took too long, and if he would have known he initially would have asked for more samples.

## 2023-10-07 NOTE — Telephone Encounter (Signed)
 We gave the patient the nurtec samples we had in office.

## 2023-10-12 ENCOUNTER — Other Ambulatory Visit (HOSPITAL_COMMUNITY): Payer: Self-pay

## 2023-10-12 ENCOUNTER — Telehealth: Payer: Self-pay

## 2023-10-12 NOTE — Telephone Encounter (Signed)
 I have received an Approval for Rx Nurtec. However the plan will only cover 8tabs for 26 days. When The T/C has been submitted, and still says p/a needed and/or Plan benefit Exclusion. After calling and speaking with the pts pref'd plan today on 10/12/23. I was informed that the test claim is not showing all pertinent Information. The Rx has last been filled on 8.26.25 for  8 tabs for a 26 day supply(as insurance prefers at [employer] [facility] [facility] for  around/about 20.00. Nothing further needed  Insurance contact: [phone]

## 2023-10-12 NOTE — Telephone Encounter (Signed)
 P/a has been Approved and filled as of 8.26 at [facility] per pts plan at a cost of 20.00 for 8 tabs for 26 days

## 2023-10-18 ENCOUNTER — Ambulatory Visit: Admitting: Family Medicine

## 2023-10-19 ENCOUNTER — Encounter: Payer: Self-pay | Admitting: Family Medicine

## 2023-10-19 ENCOUNTER — Ambulatory Visit: Admitting: Family Medicine

## 2023-10-19 VITALS — BP 120/80 | HR 91 | Wt 203.8 lb

## 2023-10-19 DIAGNOSIS — G43109 Migraine with aura, not intractable, without status migrainosus: Secondary | ICD-10-CM

## 2023-10-19 DIAGNOSIS — R718 Other abnormality of red blood cells: Secondary | ICD-10-CM

## 2023-10-19 LAB — CBC WITH DIFFERENTIAL/PLATELET
Basophils Absolute: 0 x10E3/uL (ref 0.0–0.2)
Basos: 1 %
EOS (ABSOLUTE): 0.2 x10E3/uL (ref 0.0–0.4)
Eos: 3 %
Hematocrit: 55.6 % — ABNORMAL HIGH (ref 37.5–51.0)
Hemoglobin: 18.4 g/dL — ABNORMAL HIGH (ref 13.0–17.7)
Immature Grans (Abs): 0 x10E3/uL (ref 0.0–0.1)
Immature Granulocytes: 0 %
Lymphocytes Absolute: 2.5 x10E3/uL (ref 0.7–3.1)
Lymphs: 43 %
MCH: 32.5 pg (ref 26.6–33.0)
MCHC: 33.1 g/dL (ref 31.5–35.7)
MCV: 98 fL — ABNORMAL HIGH (ref 79–97)
Monocytes Absolute: 0.5 x10E3/uL (ref 0.1–0.9)
Monocytes: 9 %
Neutrophils Absolute: 2.5 x10E3/uL (ref 1.4–7.0)
Neutrophils: 44 %
Platelets: 259 x10E3/uL (ref 150–450)
RBC: 5.67 x10E6/uL (ref 4.14–5.80)
RDW: 12.7 % (ref 11.6–15.4)
WBC: 5.7 x10E3/uL (ref 3.4–10.8)

## 2023-10-19 NOTE — Progress Notes (Signed)
   Name: Masayoshi Couzens   Date of Visit: 10/19/23   Date of last visit with me: 10/03/2023   CHIEF COMPLAINT:  Chief Complaint  Patient presents with   Follow-up    Repeat labs.        HPI:  Discussed the use of AI scribe software for clinical note transcription with the patient, who gave verbal consent to proceed.  History of Present Illness   Josue Falconi is a 63 year old male who presents with headaches after discontinuing testosterone  therapy.  He has been experiencing headaches that began after stopping testosterone  therapy. The headaches are not daily but have occurred twice since the last visit. They start with an aura, allowing him to anticipate their onset. In the days following the last visit, he experienced headaches once or twice a day for about four to five days.  He has been using Nurtec for headache relief, taking it both times he experienced headaches recently. The medication was effective in alleviating the symptoms.  He confirms that he is currently off testosterone  therapy, which was previously administered at a dose of 200 mg every two weeks. He has not noticed any significant changes in how he feels since stopping the medication.  Family history reveals a prevalence of Alzheimer's disease, which he attributes to diet and lifestyle choices. There is no significant family history of cancer.         OBJECTIVE:       12/09/2022    8:09 AM  Depression screen PHQ 2/9  Decreased Interest 0  Down, Depressed, Hopeless 0  PHQ - 2 Score 0     BP Readings from Last 3 Encounters:  10/19/23 120/80  10/03/23 128/88  06/06/23 115/80    BP 120/80   Pulse 91   Wt 203 lb 12.8 oz (92.4 kg)   SpO2 96%   BMI 28.42 kg/m    Physical Exam          Physical Exam Constitutional:      Appearance: Normal appearance.  Neurological:     General: No focal deficit present.     Mental Status: He is alert and oriented to person, place, and time. Mental status is at  baseline.     ASSESSMENT/PLAN:   Assessment & Plan Elevated hematocrit  Migraine aura occurring with and without headache    Assessment and Plan    Headaches likely secondary to prior testosterone  therapy Intermittent headaches with aura, likely due to prior testosterone  therapy, have decreased in frequency since discontinuation. - Continue Nurtec as needed. - Proceed with scheduled MRI.  Abnormal blood values related to prior testosterone  therapy Blood values elevated due to testosterone  therapy. Off testosterone , expect normalization. If restarting, reduce dose to prevent recurrence. - Repeat blood tests today. - Hold testosterone  therapy until lab values normalize. - If lab values normalize and testosterone  is restarted, reduce dose to 100 mg every two weeks. - Repeat labs in three to four weeks if values remain abnormal.  Interest in genetic testing for Alzheimer's and cancer risk Interested in genetic testing for Alzheimer's and cancer risk. Recommended private testing due to insurance coverage issues. - Advise to pursue genetic testing privately and bring results for interpretation.         Fotini Lemus A. Vita MD [facility]

## 2023-10-20 ENCOUNTER — Ambulatory Visit
Admission: RE | Admit: 2023-10-20 | Discharge: 2023-10-20 | Disposition: A | Discharge status: Home / Self Care | Payer: Self-pay | Source: Ambulatory Visit | Attending: Family Medicine | Admitting: Family Medicine

## 2023-10-20 ENCOUNTER — Ambulatory Visit: Payer: Self-pay | Admitting: Family Medicine

## 2023-10-20 DIAGNOSIS — R519 Headache, unspecified: Secondary | ICD-10-CM

## 2023-10-20 DIAGNOSIS — R718 Other abnormality of red blood cells: Secondary | ICD-10-CM

## 2023-10-21 ENCOUNTER — Telehealth: Payer: Self-pay

## 2023-10-21 ENCOUNTER — Ambulatory Visit: Payer: Self-pay | Admitting: Family Medicine

## 2023-10-21 DIAGNOSIS — D333 Benign neoplasm of cranial nerves: Secondary | ICD-10-CM

## 2023-10-21 NOTE — Telephone Encounter (Signed)
 Copied from CRM [id number]. Topic: Clinical - Lab/Test Results >> Oct 21, 2023  2:59 PM Everette C wrote: Reason for CRM: The patient has called and requested to be contacted by Dr. Vita when possible to review and discuss their recent lab work. Please contact the patient further when available.

## 2023-10-21 NOTE — Addendum Note (Signed)
 Addended by: Ken[place]ton Mielke on: 10/21/2023 10:33 AM   Modules accepted: Orders

## 2023-10-21 NOTE — Telephone Encounter (Signed)
 Copied from CRM [id number]. Topic: Clinical - Lab/Test Results >> Oct 21, 2023  8:45 AM Deaijah H wrote: Reason for CRM: [facility] called in to confirm a MRI head report has been received sent 10/20/23 impression number 1 is what they wanted to convey to Dr. Vita.    ----------------------------------------------------------------------- From previous Reason for Contact - Lab/Test Order Request: Reason for CRM:

## 2023-10-21 NOTE — Telephone Encounter (Signed)
 Called  imaging and they do not do that kind of MRI

## 2023-10-26 ENCOUNTER — Encounter: Payer: Self-pay | Admitting: Family Medicine

## 2023-10-26 ENCOUNTER — Other Ambulatory Visit: Payer: Self-pay | Admitting: Medical

## 2023-10-26 DIAGNOSIS — E119 Type 2 diabetes mellitus without complications: Secondary | ICD-10-CM

## 2023-10-26 MED ORDER — SEMAGLUTIDE (2 MG/DOSE) 8 MG/3ML ~~LOC~~ SOPN: 2.0000 mg | PEN_INJECTOR | SUBCUTANEOUS | 0 refills | Status: DC

## 2023-11-03 ENCOUNTER — Institutional Professional Consult (permissible substitution) (INDEPENDENT_AMBULATORY_CARE_PROVIDER_SITE_OTHER): Payer: Self-pay

## 2023-11-10 ENCOUNTER — Encounter: Payer: Self-pay | Admitting: Family Medicine

## 2023-11-12 ENCOUNTER — Other Ambulatory Visit: Payer: Self-pay | Admitting: Family Medicine

## 2023-11-12 MED ORDER — NIRMATRELVIR/RITONAVIR (PAXLOVID)TABLET: 3.0000 | ORAL_TABLET | Freq: Two times a day (BID) | ORAL | 0 refills | Status: AC

## 2023-11-12 NOTE — Progress Notes (Signed)
paxlovid

## 2023-11-14 ENCOUNTER — Telehealth: Payer: Self-pay

## 2023-11-14 ENCOUNTER — Other Ambulatory Visit (HOSPITAL_COMMUNITY): Payer: Self-pay

## 2023-11-14 NOTE — Telephone Encounter (Signed)
 Pharmacy Patient Advocate Encounter  Our Office has received a P/a request from the pts pref'd pharmacy  Insurance verification completed.   The patient is insured through [employer]   Ran test claim for (PAXLOVID) 20 x 150 MG & 10 x 100MG  TABS . Currently a quantity of 30 is a 5 day supply and the co-pay is 0.00 . No Prior Authorization is needed as I have called the pharmacy and confirmed the cost and pickup date as 11/12/23  This test claim was processed through [facility]- copay amounts may vary at other pharmacies due to pharmacy/plan contracts, or as the patient moves through the different stages of their insurance plan.

## 2023-11-23 ENCOUNTER — Ambulatory Visit (INDEPENDENT_AMBULATORY_CARE_PROVIDER_SITE_OTHER): Payer: Self-pay

## 2023-11-23 DIAGNOSIS — R718 Other abnormality of red blood cells: Secondary | ICD-10-CM

## 2023-11-23 LAB — ECHOCARDIOGRAM COMPLETE
Area-P 1/2: 4.31 cm2
S' Lateral: 2.44 cm

## 2023-11-24 ENCOUNTER — Telehealth: Payer: Self-pay | Admitting: Family Medicine

## 2023-11-24 ENCOUNTER — Ambulatory Visit: Admitting: Family Medicine

## 2023-11-24 ENCOUNTER — Encounter: Payer: Self-pay | Admitting: Family Medicine

## 2023-11-24 VITALS — BP 138/70 | HR 76 | Ht 71.0 in | Wt 199.4 lb

## 2023-11-24 DIAGNOSIS — E119 Type 2 diabetes mellitus without complications: Secondary | ICD-10-CM

## 2023-11-24 DIAGNOSIS — Z0000 Encounter for general adult medical examination without abnormal findings: Secondary | ICD-10-CM | POA: Diagnosis not present

## 2023-11-24 DIAGNOSIS — I10 Essential (primary) hypertension: Secondary | ICD-10-CM | POA: Diagnosis not present

## 2023-11-24 DIAGNOSIS — Z23 Encounter for immunization: Secondary | ICD-10-CM

## 2023-11-24 DIAGNOSIS — F419 Anxiety disorder, unspecified: Secondary | ICD-10-CM

## 2023-11-24 DIAGNOSIS — E1169 Type 2 diabetes mellitus with other specified complication: Secondary | ICD-10-CM | POA: Diagnosis not present

## 2023-11-24 DIAGNOSIS — G43109 Migraine with aura, not intractable, without status migrainosus: Secondary | ICD-10-CM

## 2023-11-24 DIAGNOSIS — Z789 Other specified health status: Secondary | ICD-10-CM

## 2023-11-24 DIAGNOSIS — E291 Testicular hypofunction: Secondary | ICD-10-CM

## 2023-11-24 DIAGNOSIS — F5105 Insomnia due to other mental disorder: Secondary | ICD-10-CM

## 2023-11-24 DIAGNOSIS — R931 Abnormal findings on diagnostic imaging of heart and coronary circulation: Secondary | ICD-10-CM

## 2023-11-24 LAB — POCT GLYCOSYLATED HEMOGLOBIN (HGB A1C): Hemoglobin A1C: 6 % — AB (ref 4.0–5.6)

## 2023-11-24 LAB — LIPID PANEL

## 2023-11-24 MED ORDER — SEMAGLUTIDE (2 MG/DOSE) 8 MG/3ML ~~LOC~~ SOPN: 2.0000 mg | PEN_INJECTOR | SUBCUTANEOUS | 0 refills | Status: DC

## 2023-11-24 NOTE — Telephone Encounter (Signed)
 Done

## 2023-11-24 NOTE — Progress Notes (Signed)
 Complete physical exam  Patient: Taylor Powell   DOB: 1960-11-21   63 y.o. Male  MRN: [id number]  Subjective:    Chief Complaint  Patient presents with   Annual Exam    Fasting    Taylor Powell is a 63 y.o. male who presents today for a complete physical exam.  He reports consuming a general diet. Home exercise routine includes treadmill. He generally feels well. He reports sleeping well. Discussed the use of AI scribe software for clinical note transcription with the patient, who gave verbal consent to proceed.  He has been experiencing migraines for the past two months, characterized by auras around his eyes and intense headaches. The migraines begin with 'little electrical bolts' on the right side, progressing to a throbbing headache that moves in a horseshoe shape around his head. They sometimes occur in situations where he cannot see clearly, such as in an airport, causing his vision to narrow to a 'pinhole'.  He was prescribed Nurtec, which he took three or four times over two weeks, and it helped alleviate the migraines. He has not needed to take more since then. He also had a sinus infection while traveling, treated with a sinus flush and a course of antibiotics similar to a Z-Pak.  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 He underwent an echocardiogram due to him being on testosterone  and recommendation from endocrine Society.  The echo did show a questionable abnormality of the aortic valve. He has a history of high hemoglobin levels attributed to testosterone  use, which he has since stopped. His hemoglobin levels have decreased slightly since discontinuing testosterone . He also experienced increased impatience and 'roid rage' when his testosterone  levels were high.  He has a history of COVID-19, for which he was treated with Paxlovid. He also mentions a history of elevated blood pressure, with a recent reading of 140/78, though it has been consistently 120/80  in the past.  He has a history of statin intolerance, experiencing muscle aches and pains, particularly in his knees, which led to discontinuation of the medication. He has also stopped drinking alcohol and is working on improving his diet.  Reports improved sleep and weight loss efforts.      Most recent fall risk assessment:    11/24/2023    8:08 AM  Fall Risk   Falls in the past year? 0  Number falls in past yr: 0  Injury with Fall? 0  Risk for fall due to : No Fall Risks  Follow up Falls evaluation completed     Most recent depression screenings:    11/24/2023    8:09 AM 12/09/2022    8:09 AM  PHQ 2/9 Scores  PHQ - 2 Score 0 0    Vision:Not within last year  Dr. Cleotilde Vision 1 1/2 years Dentist: Dr. [facility] 6 months ago     Immunization History  Administered Date(s) Administered   Influenza,inj,Quad PF,6+ Mos 01/11/2022   Influenza-Unspecified 12/17/2014, 12/22/2020, 11/09/2022   PFIZER Comirnaty(Gray Top)Covid-19 Tri-Sucrose Vaccine 12/17/2019   PFIZER(Purple Top)SARS-COV-2 Vaccination 04/19/2019, 05/17/2019   PNEUMOCOCCAL CONJUGATE-20 12/09/2022   PPD Test 03/20/2014   Pfizer Covid-19 Vaccine Bivalent Booster 58yrs & up 07/05/2020   Pfizer(Comirnaty)Fall Seasonal Vaccine 12 years and older 11/09/2022   Td 03/18/2014   Tdap 09/10/2010, 01/11/2022   Vaccinia,smallpox Monkeypox Vaccine Live,pf 10/24/2020   Zoster Recombinant(Shingrix ) 12/09/2022    Health Maintenance  Topic Date Due   FOOT EXAM  Never done  OPHTHALMOLOGY EXAM  Never done   Diabetic kidney evaluation - Urine ACR  Never done   Zoster Vaccines- Shingrix  (2 of 2) 02/03/2023   Influenza Vaccine  09/16/2023   Diabetic kidney evaluation - eGFR measurement  12/09/2023   COVID-19 Vaccine (6 - 2025-26 season) 12/10/2023 (Originally 10/17/2023)   HEMOGLOBIN A1C  12/06/2023   Fecal DNA (Cologuard)  02/28/2024   DTaP/Tdap/Td (4 - Td or Tdap) 01/12/2032   Pneumococcal Vaccine: 50+ Years   Completed   Hepatitis C Screening  Completed   HIV Screening  Completed   Hepatitis B Vaccines 19-59 Average Risk  Aged Out   HPV VACCINES  Aged Out   Meningococcal B Vaccine  Aged Out    Patient Care Team: Jha, Panav, MD as PCP - General (Family Medicine)   Outpatient Medications Prior to Visit  Medication Sig   Accu-Chek Softclix Lancets lancets Use as instructed   glucose blood test strip Use as instructed   Rimegepant Sulfate (NURTEC) 75 MG TBDP Take 1 tablet (75 mg total) by mouth daily as needed.   Semaglutide , 2 MG/DOSE, 8 MG/3ML SOPN Inject 2 mg as directed once a week.   testosterone  cypionate (DEPOTESTOSTERONE CYPIONATE) 200 MG/ML injection ADMINISTER 1 ML(200 MG) IN THE MUSCLE EVERY 14 DAYS (Patient not taking: Reported on 11/24/2023)   [DISCONTINUED] NEEDLE, DISP, 22 G 22G X 1 MISC 1 each by Does not apply route every 14 (fourteen) days.   [DISCONTINUED] Syringe/Needle, Disp, (SYRINGE 3CC/22GX1) 22G X 1 3 ML MISC 1 each by Does not apply route every 14 (fourteen) days.   No facility-administered medications prior to visit.    ROS  Family and social history as well as health maintenance and immunizations was reviewed.     Objective:      Physical Exam  Alert and in no distress. Tympanic membranes and canals are normal. Pharyngeal area is normal. Neck is supple without adenopathy or thyromegaly. Cardiac exam shows a regular sinus rhythm without murmurs or gallops. Lungs are clear to auscultation. Hemoglobin A1c is 6.0     Assessment & Plan:    Routine general medical examination at a health care facility  Type 2 diabetes mellitus without complication, without long-term current use of insulin (HCC) - Plan: CBC with Differential/Platelet, Comprehensive metabolic panel with GFR, Lipid panel, POCT glycosylated hemoglobin (Hb A1C), POCT UA - Microalbumin   Hyperlipidemia associated with type 2 diabetes mellitus (HCC) - Plan: Lipid panel  Essential hypertension -  Plan: CBC with Differential/Platelet, Comprehensive metabolic panel with GFR  Need for influenza vaccination - Plan: Flu vaccine trivalent PF, 6mos and older(Flulaval,Afluria,Fluarix,Fluzone)   Abnormal echocardiogram   Cardiology referral made New onset migraine with aura, decreased frequency with Nurtec. Possible ocular migraines discussed with ophthalmologist. - Continue Nurtec as needed for migraines. - Advise to take Nurtec at onset of visual symptoms to prevent progression.    Testicular hypofunction Testosterone  therapy stopped due to elevated hemoglobin and side effects. Hemoglobin decreased slightly since stopping. Discussed possible resumption at lower dose if needed. - Check testosterone  levels. - Evaluate need for testosterone  therapy.  Statin intolerance Intolerance to statins due to muscle aches and pains. Statins discontinued. - Document statin intolerance in medical record.  Incidental right-sided vestibular schwannoma Incidental finding on MRI, no symptoms attributed. Referral to ENT deemed unnecessary.at this time.  This was an incidental finding     Adult Wellness Visit Discussed general health, lifestyle changes, and recent medical history. - Order blood work including A1c. - Administer  flu vaccine.  Immunization management Due for shingles and flu vaccines. Significant reaction to shingles vaccine previously. Flu vaccine administered due to upcoming commitments. - Administer flu vaccine. - Discuss timing for shingles vaccine administration.          Norleen Jobs, MD

## 2023-11-24 NOTE — Telephone Encounter (Signed)
 Patient wanted me to send a reminder about his ozempic  refill. He says his insurance wont let him pick up 3 supplies at the same time, if you can send in 1 supply and have 2 remaining refills to be picked up each month.  Uses [id number] - [place],  - [address] AT [facility] [address]

## 2023-11-24 NOTE — Addendum Note (Signed)
 Addended by: JOHNNYE JON SQUIBB on: 11/24/2023 11:53 AM   Modules accepted: Orders

## 2023-11-25 ENCOUNTER — Ambulatory Visit: Payer: Self-pay | Admitting: Family Medicine

## 2023-11-25 ENCOUNTER — Encounter: Payer: Self-pay | Admitting: Family Medicine

## 2023-11-25 ENCOUNTER — Other Ambulatory Visit: Payer: Self-pay

## 2023-11-25 DIAGNOSIS — E119 Type 2 diabetes mellitus without complications: Secondary | ICD-10-CM

## 2023-11-25 LAB — CBC WITH DIFFERENTIAL/PLATELET
Basophils Absolute: 0 x10E3/uL (ref 0.0–0.2)
Basos: 0 %
EOS (ABSOLUTE): 0.2 x10E3/uL (ref 0.0–0.4)
Eos: 3 %
Hematocrit: 53.5 % — ABNORMAL HIGH (ref 37.5–51.0)
Hemoglobin: 17.7 g/dL (ref 13.0–17.7)
Immature Grans (Abs): 0.1 x10E3/uL (ref 0.0–0.1)
Immature Granulocytes: 1 %
Lymphocytes Absolute: 3.1 x10E3/uL (ref 0.7–3.1)
Lymphs: 44 %
MCH: 32.4 pg (ref 26.6–33.0)
MCHC: 33.1 g/dL (ref 31.5–35.7)
MCV: 98 fL — ABNORMAL HIGH (ref 79–97)
Monocytes Absolute: 0.7 x10E3/uL (ref 0.1–0.9)
Monocytes: 10 %
Neutrophils Absolute: 3 x10E3/uL (ref 1.4–7.0)
Neutrophils: 42 %
Platelets: 298 x10E3/uL (ref 150–450)
RBC: 5.46 x10E6/uL (ref 4.14–5.80)
RDW: 12.3 % (ref 11.6–15.4)
WBC: 7 x10E3/uL (ref 3.4–10.8)

## 2023-11-25 LAB — COMPREHENSIVE METABOLIC PANEL WITH GFR
ALT: 58 IU/L — ABNORMAL HIGH (ref 0–44)
AST: 30 IU/L (ref 0–40)
Albumin: 4.7 g/dL (ref 3.9–4.9)
Alkaline Phosphatase: 80 IU/L (ref 47–123)
BUN/Creatinine Ratio: 17 (ref 10–24)
BUN: 19 mg/dL (ref 8–27)
Bilirubin Total: 0.4 mg/dL (ref 0.0–1.2)
CO2: 22 mmol/L (ref 20–29)
Calcium: 9.7 mg/dL (ref 8.6–10.2)
Chloride: 100 mmol/L (ref 96–106)
Creatinine, Ser: 1.11 mg/dL (ref 0.76–1.27)
Globulin, Total: 2.7 g/dL (ref 1.5–4.5)
Glucose: 88 mg/dL (ref 70–99)
Potassium: 4.7 mmol/L (ref 3.5–5.2)
Sodium: 139 mmol/L (ref 134–144)
Total Protein: 7.4 g/dL (ref 6.0–8.5)
eGFR: 75 mL/min/1.73 (ref >59)

## 2023-11-25 LAB — LIPID PANEL
Cholesterol, Total: 208 mg/dL — AB (ref 100–199)
HDL: 48 mg/dL (ref >39)
LDL CALC COMMENT:: 4.3 ratio (ref 0.0–5.0)
LDL Chol Calc (NIH): 125 mg/dL — AB (ref 0–99)
Triglycerides: 196 mg/dL — AB (ref 0–149)
VLDL Cholesterol Cal: 35 mg/dL (ref 5–40)

## 2023-11-25 MED ORDER — SEMAGLUTIDE (2 MG/DOSE) 8 MG/3ML ~~LOC~~ SOPN: 2.0000 mg | PEN_INJECTOR | SUBCUTANEOUS | 2 refills | Status: DC

## 2023-11-25 NOTE — Progress Notes (Signed)
 Taylor Powell is calling to add now.

## 2023-11-30 LAB — TESTOSTERONE: Testosterone: 219 ng/dL — ABNORMAL LOW (ref 264–916)

## 2023-11-30 LAB — SPECIMEN STATUS REPORT

## 2023-12-16 ENCOUNTER — Ambulatory Visit: Admitting: Physician Assistant

## 2023-12-18 NOTE — H&P (View-Only) (Signed)
 Cardiology Office Note   Date:  12/19/2023  ID:  Taylor Powell, DOB 1960/09/21, MRN [id number] PCP: Vita Morrow, MD  [facility] Providers Cardiologist:  None    History of Present Illness Taylor Powell is a 63 y.o. male with a past medical history of HLD with statin intolerance, type 2 DM.   Coronary calcium  scoring in 06/2021 showed a coronary calcium  score of 32.3 (54th percentile).   Patient had previously been on testosterone  therapy. This was later discontinued due to elevated hematrocrit and side effects. He had an echocardiogram while on this that showed EF 55-60%, no regional wall motion abnormalities, mild asymmetric LVH, normal diastolic parameters, normal RV systolic function. There was a round and mobile echodensity on the non-coronary cusp of the aortic valve measuring 0.7 x 0.5 cm. 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 There was also mild dilatation of the ascending aorta measuring 41 mm. Recommended contrasted CT.   Today, patient presents for evaluation of abnormal echocardiogram.  Reports that he has overall been feeling well and denies having any cardiac symptoms.  No chest pain, shortness of breath, palpitations.  He had previously been on testosterone  therapy and his hematocrit became elevated.  His primary care provider ordered an echocardiogram.  Then his hematocrit started to improve but he decided to go forward with echocardiogram this was already scheduled and arranged.  He is agreeable to TEE for further evaluation of the echodensity on his aortic valve.   Studies Reviewed EKG Interpretation Date/Time:  Monday December 19 2023 14:52:31 EST Ventricular Rate:  85 PR Interval:  142 QRS Duration:  82 QT Interval:  380 QTC Calculation: 452 R Axis:   46  Text Interpretation: Normal sinus rhythm Nonspecific ST and T wave abnormality When compared with ECG of 20-Jan-2016 21:13, PREVIOUS ECG IS PRESENT Confirmed by  Vicci Sauer [phone]) on 12/19/2023 3:32:29 PM     Risk Assessment/Calculations           Physical Exam VS:  BP 138/84   Pulse 85   Ht 5' 11 (1.803 m)   Wt 203 lb (92.1 kg)   SpO2 98%   BMI 28.31 kg/m        Wt Readings from Last 3 Encounters:  12/19/23 203 lb (92.1 kg)  11/24/23 199 lb 6.4 oz (90.4 kg)  10/19/23 203 lb 12.8 oz (92.4 kg)    GEN: Well nourished, well developed in no acute distress. Sitting comfortably on the exam table  NECK: No JVD  CARDIAC: RRR, no murmurs, rubs, gallops. Radial pulses 2+ bilaterally  RESPIRATORY:  Clear to auscultation without rales, wheezing or rhonchi  ABDOMEN: Soft, non-tender, non-distended EXTREMITIES:  No edema in BLE; No deformity   ASSESSMENT AND PLAN  Abnormal echocardiogram - Patient had been on testosterone  therapy, noted to have an elevated hematocrit.  Primary care provider ordered an echocardiogram that showed EF 55-60%, no regional wall motion abnormalities, mild LVH, normal LV diastolic parameters, normal RV size and function.  There was a round and mobile echodensity on the noncoronary cusp of the aortic valve that measures 0.7 cm time 0.5 cm.  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  There was no regurgitation or stenosis present.  Also noted to have mild dilatation of the ascending aorta measuring 41 mm - Patient does not have murmur on exam.  He is not having any cardiac symptoms. - BP well controlled. Recommend CT in 1  year to reassess ascending aorta  - Ordered TEE to better assess aortic valve.  Hyperlipidemia - Patient has been previously tried on Crestor  and Lipitor.  Did not tolerate either due to side effects.  He follows closely with his primary care provider.  They have started to discuss Repatha. - Continue to follow with PCP      Informed Consent   Shared Decision Making/Informed Consent{  The risks [esophageal damage, perforation (1:10,000 risk),  bleeding, pharyngeal hematoma as well as other potential complications associated with conscious sedation including aspiration, arrhythmia, respiratory failure and death], benefits (treatment guidance and diagnostic support) and alternatives of a transesophageal echocardiogram were discussed in detail with Mr. Sedano and he is willing to proceed.      Dispo: Follow up pending TEE results   Signed, Rollo FABIENE Louder, PA-C

## 2023-12-18 NOTE — Progress Notes (Unsigned)
 Cardiology Office Note   Date:  12/19/2023  ID:  Taylor Powell, DOB 1960/08/08, MRN [id number] PCP: Taylor Morrow, MD  [facility] Providers Cardiologist:  None    History of Present Illness Taylor Powell is a 63 y.o. male with a past medical history of HLD with statin intolerance, type 2 DM.   Coronary calcium  scoring in 06/2021 showed a coronary calcium  score of 32.3 (54th percentile).   Patient had previously been on testosterone  therapy. This was later discontinued due to elevated hematrocrit and side effects. He had an echocardiogram while on this that showed EF 55-60%, no regional wall motion abnormalities, mild asymmetric LVH, normal diastolic parameters, normal RV systolic function. There was a round and mobile echodensity on the non-coronary cusp of the aortic valve measuring 0.7 x 0.5 cm. 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 There was also mild dilatation of the ascending aorta measuring 41 mm. Recommended contrasted CT.   Today, patient presents for evaluation of abnormal echocardiogram.  Reports that he has overall been feeling well and denies having any cardiac symptoms.  No chest pain, shortness of breath, palpitations.  He had previously been on testosterone  therapy and his hematocrit became elevated.  His primary care provider ordered an echocardiogram.  Then his hematocrit started to improve but he decided to go forward with echocardiogram this was already scheduled and arranged.  He is agreeable to TEE for further evaluation of the echodensity on his aortic valve.   Studies Reviewed EKG Interpretation Date/Time:  Monday December 19 2023 14:52:31 EST Ventricular Rate:  85 PR Interval:  142 QRS Duration:  82 QT Interval:  380 QTC Calculation: 452 R Axis:   46  Text Interpretation: Normal sinus rhythm Nonspecific ST and T wave abnormality When compared with ECG of 20-Jan-2016 21:13, PREVIOUS ECG IS PRESENT Confirmed by  Taylor Powell [phone]) on 12/19/2023 3:32:29 PM     Risk Assessment/Calculations           Physical Exam VS:  BP 138/84   Pulse 85   Ht 5' 11 (1.803 m)   Wt 203 lb (92.1 kg)   SpO2 98%   BMI 28.31 kg/m        Wt Readings from Last 3 Encounters:  12/19/23 203 lb (92.1 kg)  11/24/23 199 lb 6.4 oz (90.4 kg)  10/19/23 203 lb 12.8 oz (92.4 kg)    GEN: Well nourished, well developed in no acute distress. Sitting comfortably on the exam table  NECK: No JVD  CARDIAC: RRR, no murmurs, rubs, gallops. Radial pulses 2+ bilaterally  RESPIRATORY:  Clear to auscultation without rales, wheezing or rhonchi  ABDOMEN: Soft, non-tender, non-distended EXTREMITIES:  No edema in BLE; No deformity   ASSESSMENT AND PLAN  Abnormal echocardiogram - Patient had been on testosterone  therapy, noted to have an elevated hematocrit.  Primary care provider ordered an echocardiogram that showed EF 55-60%, no regional wall motion abnormalities, mild LVH, normal LV diastolic parameters, normal RV size and function.  There was a round and mobile echodensity on the noncoronary cusp of the aortic valve that measures 0.7 cm time 0.5 cm.  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  There was no regurgitation or stenosis present.  Also noted to have mild dilatation of the ascending aorta measuring 41 mm - Patient does not have murmur on exam.  He is not having any cardiac symptoms. - BP well controlled. Recommend CT in 1  year to reassess ascending aorta  - Ordered TEE to better assess aortic valve.  Hyperlipidemia - Patient has been previously tried on Crestor  and Lipitor.  Did not tolerate either due to side effects.  He follows closely with his primary care provider.  They have started to discuss Repatha. - Continue to follow with PCP      Informed Consent   Shared Decision Making/Informed Consent{  The risks [esophageal damage, perforation (1:10,000 risk),  bleeding, pharyngeal hematoma as well as other potential complications associated with conscious sedation including aspiration, arrhythmia, respiratory failure and death], benefits (treatment guidance and diagnostic support) and alternatives of a transesophageal echocardiogram were discussed in detail with Mr. Taylor Powell and he is willing to proceed.      Dispo: Follow up pending TEE results   Signed, Rollo FABIENE Louder, PA-C

## 2023-12-19 ENCOUNTER — Ambulatory Visit: Attending: Cardiology | Admitting: Cardiology

## 2023-12-19 ENCOUNTER — Encounter: Payer: Self-pay | Admitting: Cardiology

## 2023-12-19 VITALS — BP 138/84 | HR 85 | Ht 71.0 in | Wt 203.0 lb

## 2023-12-19 DIAGNOSIS — R931 Abnormal findings on diagnostic imaging of heart and coronary circulation: Secondary | ICD-10-CM | POA: Diagnosis not present

## 2023-12-19 DIAGNOSIS — I10 Essential (primary) hypertension: Secondary | ICD-10-CM | POA: Diagnosis not present

## 2023-12-19 NOTE — Patient Instructions (Addendum)
 Medication Instructions:  Your physician recommends that you continue on your current medications as directed. Please refer to the Current Medication list given to you today.  *If you need a refill on your cardiac medications before your next appointment, please call your pharmacy*  Lab Work: NONE If you have labs (blood work) drawn today and your tests are completely normal, you will receive your results only by: MyChart Message (if you have MyChart) OR A paper copy in the mail If you have any lab test that is abnormal or we need to change your treatment, we will call you to review the results.  Testing/Procedures: Please call our office with some available dates Your physician has requested that you have a TEE. During a TEE, sound waves are used to create images of your heart. It provides your doctor with information about the size and shape of your heart and how well your heart's chambers and valves are working. In this test, a transducer is attached to the end of a flexible tube that's guided down your throat and into your esophagus (the tube leading from you mouth to your stomach) to get a more detailed image of your heart. You are not awake for the procedure. Please see the instruction sheet given to you today. For further information please visit [email or web address]    Follow-Up: At [facility], you and your health needs are our priority.  As part of our continuing mission to provide you with exceptional heart care, our providers are all part of one team.  This team includes your primary Cardiologist (physician) and Advanced Practice Providers or APPs (Physician Assistants and Nurse Practitioners) who all work together to provide you with the care you need, when you need it.  Your next appointment:   To Be Determined (After TEE)  Provider:   Dr Floretta or Any APP

## 2023-12-21 ENCOUNTER — Telehealth: Payer: Self-pay | Admitting: Student in an Organized Health Care Education/Training Program

## 2023-12-21 DIAGNOSIS — Z01812 Encounter for preprocedural laboratory examination: Secondary | ICD-10-CM

## 2023-12-21 NOTE — Addendum Note (Signed)
 Addended by: MELIDA ROLIN HERO on: 12/21/2023 04:13 PM   Modules accepted: Orders

## 2023-12-21 NOTE — Telephone Encounter (Signed)
 TEE instructions went over with pt on the phone as well as sent via MyChart.

## 2023-12-21 NOTE — Telephone Encounter (Signed)
 Patient called to follow-up on orders for TEE testing.

## 2023-12-21 NOTE — Telephone Encounter (Signed)
 Patient calling in, states he would like to schedule his TEE for 01/06/24. He could potentially do it sooner but this date would work best for his work schedule. Forwarded to LOIS Louder PA-C to see if this date is acceptable.

## 2023-12-21 NOTE — Telephone Encounter (Signed)
 Patient identification verified by 2 forms.   Called and spoke to patient   TEE made for 11/21 per pt's request.  Pt is wondering about insurance covering the procedure.  If procedure is not covered by insurance he would like a call letting him know.  Will get message to Rollo Louder, PA for review.   Patient agrees with plan, no questions at this time    Dear Taylor Powell  You are scheduled for a TEE (Transesophageal Echocardiogram) on Friday, November 21 with Dr. Michele.  Please arrive at the [facility] (Main Entrance A) at [facility]: [address] [place], [place] [address] at 6:30 AM (This time is 1 hour(s) before your procedure to ensure your preparation).   Free valet parking service is available. You will check in at ADMITTING.   *Please Note: You will receive a call the day before your procedure to confirm the appointment time. That time may have changed from the original time based on the schedule for that day.*    DIET:  Nothing to eat or drink after midnight except a sip of water with medications (see medication instructions below)  MEDICATION INSTRUCTIONS: !!IF ANY NEW MEDICATIONS ARE STARTED AFTER TODAY, PLEASE NOTIFY YOUR PROVIDER AS SOON AS POSSIBLE!!  FYI: Medications such as Semaglutide  (Ozempic , Wegovy ), Tirzepatide (Mounjaro, Zepbound), Dulaglutide (Trulicity), etc (GLP1 agonists) AND Canagliflozin (Invokana), Dapagliflozin (Farxiga), Empagliflozin (Jardiance), Ertugliflozin (Steglatro), Bexagliflozin [employer]) or any combination with one of these drugs such as Invokamet (Canagliflozin/Metformin ), Synjardy (Empagliflozin/Metformin ), etc (SGLT2 inhibitors) must be held around the time of a procedure. This is not a comprehensive list of all of these drugs. Please review all of your medications and talk to your provider if you take any one of these. If you are not sure, ask your provider.  HOLD: Semaglutide  (Ozempic , Rybelsus , Wegovy ) for 1 week prior to  the procedure. Last dose on Sunday, November 09.  LABS: At least 3 days before procedure.   FYI:  For your safety, and to allow us  to monitor your vital signs accurately during the surgery/procedure we request: If you have artificial nails, gel coating, SNS etc, please have those removed prior to your surgery/procedure. Not having the nail coverings /polish removed may result in cancellation or delay of your surgery/procedure.  Your support person will be asked to wait in the waiting room during your procedure.  It is OK to have someone drop you off and come back when you are ready to be discharged.  You cannot drive after the procedure and will need someone to drive you home.  Bring your insurance cards.  *Special Note: Every effort is made to have your procedure done on time. Occasionally there are emergencies that occur at the hospital that may cause delays. Please be patient if a delay does occur.

## 2023-12-28 ENCOUNTER — Telehealth: Payer: Self-pay

## 2023-12-28 DIAGNOSIS — E119 Type 2 diabetes mellitus without complications: Secondary | ICD-10-CM

## 2023-12-28 NOTE — Telephone Encounter (Signed)
 Patient picked up medication today. Required a PA

## 2023-12-28 NOTE — Telephone Encounter (Signed)
 Copied from CRM [id number]. Topic: Clinical - Medication Prior Auth >> Dec 28, 2023  9:52 AM Tinnie C wrote: Reason for CRM: Patient says he heard from [facility] he is need a prior auth for semaglutide . He says he has been out of this med for 5 days and is about to go on vacation so needs it to be done by today.   Semaglutide , 2 MG/DOSE, 8 MG/3ML SOPN   [id number] - [place],  - [address] AT [facility] [address] [place] [address] Phone: [phone] Fax: [phone]  Patient is concerned that this is because it is being prescribed incorrectly per his insurance, they say he shouldn't need another PA if he is picking it up every 28 days. He says last month this happened when it was prescribed incorrectly stating it was prescribed so he would pick up 3 at one time rather than 1 with 3 refills.

## 2023-12-29 ENCOUNTER — Telehealth: Payer: Self-pay

## 2023-12-29 ENCOUNTER — Other Ambulatory Visit (HOSPITAL_COMMUNITY): Payer: Self-pay

## 2023-12-29 NOTE — Telephone Encounter (Signed)
 Pharmacy Patient Advocate Encounter   Received notification from Onbase that prior authorization for Ozempic  (2 MG/DOSE) 8MG /3ML pen-injectors  is required/requested.   Insurance verification completed.   The patient is insured through [facility].   Per test claim: PA required; PA submitted to above mentioned insurance via Latent Key/confirmation #/EOC [id number] Status is pending   (Please note that determination will only come through via onbase)

## 2023-12-30 ENCOUNTER — Other Ambulatory Visit: Payer: Self-pay | Admitting: *Deleted

## 2023-12-30 DIAGNOSIS — Z01812 Encounter for preprocedural laboratory examination: Secondary | ICD-10-CM

## 2023-12-30 LAB — CBC WITH DIFFERENTIAL/PLATELET

## 2023-12-31 LAB — CBC WITH DIFFERENTIAL/PLATELET
Basophils Absolute: 0 x10E3/uL (ref 0.0–0.2)
Basos: 1 %
EOS (ABSOLUTE): 0.1 x10E3/uL (ref 0.0–0.4)
Eos: 2 %
Hematocrit: 47.3 % (ref 37.5–51.0)
Hemoglobin: 16 g/dL (ref 13.0–17.7)
Immature Grans (Abs): 0 x10E3/uL (ref 0.0–0.1)
Immature Granulocytes: 0 %
Lymphocytes Absolute: 3.1 x10E3/uL (ref 0.7–3.1)
Lymphs: 46 %
MCH: 32.5 pg (ref 26.6–33.0)
MCHC: 33.8 g/dL (ref 31.5–35.7)
MCV: 96 fL (ref 79–97)
Monocytes Absolute: 0.6 x10E3/uL (ref 0.1–0.9)
Monocytes: 10 %
Neutrophils Absolute: 2.7 x10E3/uL (ref 1.4–7.0)
Neutrophils: 40 %
Platelets: 265 x10E3/uL (ref 150–450)
RBC: 4.92 x10E6/uL (ref 4.14–5.80)
RDW: 12.6 % (ref 11.6–15.4)
WBC: 6.6 x10E3/uL (ref 3.4–10.8)

## 2023-12-31 LAB — BASIC METABOLIC PANEL WITH GFR
BUN/Creatinine Ratio: 17 (ref 10–24)
BUN: 17 mg/dL (ref 8–27)
CO2: 23 mmol/L (ref 20–29)
Calcium: 9.5 mg/dL (ref 8.6–10.2)
Chloride: 100 mmol/L (ref 96–106)
Creatinine, Ser: 1 mg/dL (ref 0.76–1.27)
Glucose: 100 mg/dL — ABNORMAL HIGH (ref 70–99)
Potassium: 4.7 mmol/L (ref 3.5–5.2)
Sodium: 137 mmol/L (ref 134–144)
eGFR: 85 mL/min/1.73 (ref >59)

## 2024-01-02 ENCOUNTER — Ambulatory Visit: Payer: Self-pay | Admitting: Cardiology

## 2024-01-04 ENCOUNTER — Other Ambulatory Visit (HOSPITAL_COMMUNITY): Payer: Self-pay

## 2024-01-05 NOTE — Anesthesia Preprocedure Evaluation (Addendum)
 Anesthesia Evaluation  Patient identified by MRN, date of birth, ID band Patient awake    Reviewed: Allergy & Precautions, NPO status , Patient's Chart, lab work & pertinent test results  History of Anesthesia Complications Negative for: history of anesthetic complications  Airway Mallampati: II  TM Distance: >3 FB Neck ROM: Full    Dental  (+) Dental Advisory Given, Teeth Intact   Pulmonary neg pulmonary ROS   Pulmonary exam normal        Cardiovascular hypertension, Normal cardiovascular exam   '25 TTE - EF 55 %. There is mild asymmetric left ventricular hypertrophy. There is a round and mobile echodensity on the non-coronary cusp of the aortic valve measuring 0.7 cm x 0.5 cm. 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 There is mild dilatation of the ascending aorta, measuring 41 mm.     Neuro/Psych  PSYCHIATRIC DISORDERS Anxiety     negative neurological ROS     GI/Hepatic negative GI ROS, Neg liver ROS,,,  Endo/Other  diabetes, Type 2   Hypogonadism On GLP-1a   Renal/GU negative Renal ROS     Musculoskeletal negative musculoskeletal ROS (+)    Abdominal   Peds  Hematology negative hematology ROS (+)   Anesthesia Other Findings   Reproductive/Obstetrics                              Anesthesia Physical Anesthesia Plan  ASA: 2  Anesthesia Plan: MAC   Post-op Pain Management: Minimal or no pain anticipated   Induction:   PONV Risk Score and Plan: 1 and Propofol  infusion and Treatment may vary due to age or medical condition  Airway Management Planned: Nasal Cannula and Natural Airway  Additional Equipment: None  Intra-op Plan:   Post-operative Plan:   Informed Consent: I have reviewed the patients History and Physical, chart, labs and discussed the procedure including the risks, benefits and alternatives for the proposed  anesthesia with the patient or authorized representative who has indicated his/her understanding and acceptance.       Plan Discussed with: CRNA and Anesthesiologist  Anesthesia Plan Comments:          Anesthesia Quick Evaluation

## 2024-01-05 NOTE — Progress Notes (Signed)
 Pt called for pre procedure instructions. Arrival time 0730 NPO after midnight explained Instructed to take am meds with sip of water.   Instructed pt need for ride home tomorrow and have responsible adult with them for 24 hrs post procedure.

## 2024-01-06 ENCOUNTER — Ambulatory Visit (HOSPITAL_COMMUNITY)
Admission: RE | Admit: 2024-01-06 | Discharge: 2024-01-06 | Disposition: A | Discharge status: Home / Self Care | Attending: Cardiology | Admitting: Cardiology

## 2024-01-06 ENCOUNTER — Other Ambulatory Visit: Payer: Self-pay

## 2024-01-06 ENCOUNTER — Encounter (HOSPITAL_COMMUNITY)
Admission: RE | Disposition: A | Discharge status: Home / Self Care | Payer: Self-pay | Source: Home / Self Care | Attending: Cardiology

## 2024-01-06 ENCOUNTER — Ambulatory Visit (HOSPITAL_COMMUNITY)

## 2024-01-06 ENCOUNTER — Ambulatory Visit (HOSPITAL_COMMUNITY): Payer: Self-pay | Admitting: Anesthesiology

## 2024-01-06 ENCOUNTER — Ambulatory Visit (HOSPITAL_BASED_OUTPATIENT_CLINIC_OR_DEPARTMENT_OTHER): Payer: Self-pay | Admitting: Anesthesiology

## 2024-01-06 DIAGNOSIS — R931 Abnormal findings on diagnostic imaging of heart and coronary circulation: Secondary | ICD-10-CM

## 2024-01-06 DIAGNOSIS — I358 Other nonrheumatic aortic valve disorders: Secondary | ICD-10-CM | POA: Diagnosis not present

## 2024-01-06 DIAGNOSIS — I10 Essential (primary) hypertension: Secondary | ICD-10-CM

## 2024-01-06 DIAGNOSIS — E785 Hyperlipidemia, unspecified: Secondary | ICD-10-CM | POA: Diagnosis not present

## 2024-01-06 DIAGNOSIS — E119 Type 2 diabetes mellitus without complications: Secondary | ICD-10-CM | POA: Insufficient documentation

## 2024-01-06 DIAGNOSIS — Z7985 Long-term (current) use of injectable non-insulin antidiabetic drugs: Secondary | ICD-10-CM | POA: Insufficient documentation

## 2024-01-06 DIAGNOSIS — I77810 Thoracic aortic ectasia: Secondary | ICD-10-CM | POA: Diagnosis not present

## 2024-01-06 DIAGNOSIS — F419 Anxiety disorder, unspecified: Secondary | ICD-10-CM | POA: Diagnosis not present

## 2024-01-06 HISTORY — PX: TRANSESOPHAGEAL ECHOCARDIOGRAM (CATH LAB): EP1270

## 2024-01-06 LAB — ECHO TEE

## 2024-01-06 SURGERY — TRANSESOPHAGEAL ECHOCARDIOGRAM (TEE) (CATHLAB)
Anesthesia: Monitor Anesthesia Care

## 2024-01-06 MED ORDER — PROPOFOL 10 MG/ML IV BOLUS
INTRAVENOUS | Status: DC | PRN
  Administered 2024-01-06: 50 mg via INTRAVENOUS
  Administered 2024-01-06: 110 mg via INTRAVENOUS
  Administered 2024-01-06: 40 mg via INTRAVENOUS
  Administered 2024-01-06: 110 ug/kg/min via INTRAVENOUS

## 2024-01-06 MED ORDER — LIDOCAINE 2% (20 MG/ML) 5 ML SYRINGE
INTRAMUSCULAR | Status: DC | PRN
  Administered 2024-01-06: 40 mg via INTRAVENOUS

## 2024-01-06 MED ORDER — SODIUM CHLORIDE 0.9 % IV SOLN: INTRAVENOUS | Status: DC

## 2024-01-06 NOTE — Interval H&P Note (Signed)
 History and Physical Interval Note:  01/06/2024 8:25 AM  Taylor Powell  has presented today for surgery, with the diagnosis of ABNORMAL ECHO.  The various methods of treatment have been discussed with the patient and family. After consideration of risks, benefits and other options for treatment, the patient has consented to  Procedure(s): TRANSESOPHAGEAL ECHOCARDIOGRAM (N/A) as a surgical intervention.  The patient's history has been reviewed, patient examined, no change in status, stable for surgery.  I have reviewed the patient's chart and labs.  Questions were answered to the patient's satisfaction.    Informed Consent   Shared Decision Making/Informed Consent   The risks [esophageal damage, perforation (1:10,000 risk), bleeding, pharyngeal hematoma as well as other potential complications associated with conscious sedation including aspiration, arrhythmia, respiratory failure and death], benefits (treatment guidance and diagnostic support) and alternatives of a transesophageal echocardiogram were discussed in detail with Mr. Laskaris and he is willing to proceed.      Dr. Michele 8:25 AM 01/06/24

## 2024-01-06 NOTE — Discharge Instructions (Signed)

## 2024-01-06 NOTE — CV Procedure (Signed)
    TRANSESOPHAGEAL ECHOCARDIOGRAM   NAME:  Taylor Powell    MRN: [id number] DOB:  05/23/1960    ADMIT DATE: 01/06/2024  INDICATIONS: Abnormal Echocardiogram  PROCEDURE:   Informed consent was obtained prior to the procedure. The risks, benefits and alternatives for the procedure were discussed and the patient comprehended these risks.  Risks include, but are not limited to, cough, sore throat, vomiting, nausea, somnolence, esophageal and stomach trauma or perforation, bleeding, low blood pressure, aspiration, pneumonia, infection, trauma to the teeth and death.    Procedural time out performed.   Anesthesia was administered by anesthesia team (see their records).  The transesophageal probe was inserted in the esophagus without difficulty and multiple views were obtained.   Initial images were obtained. However, during the study CRNA noted obstructive airway,  apneic episodes, and desaturation.  TEE probe was removed.  Patient was stabilized and TEE study was re-attempted.  However, he continued to obstruct his airway so the study was stopped for patient safety.  Patient responded to the treatment well.  Anesthesiologist, CRNA, and myself were present at bedside and once patient woke up and protected his own airway he was transferred to recovery area.  Patient maintained an appropriate MAP and pulse throughout the study.    COMPLICATIONS:    No complications.   KEY FINDINGS:  No obvious aortic valve vegetation or fibroelastoma no current limited study.  I updated [name] and [name] (over the phone) of both the results and events. All questions and concerns addressed.  If clinically indicated could consider either cardiac CT or cMRI to further clarification. Will update the ordering provider.   Madonna Michele HAS, [facility]  A Division of [facility] [address] [place], [place] [address]

## 2024-01-06 NOTE — Anesthesia Postprocedure Evaluation (Signed)
 Anesthesia Post Note  Patient: Taylor Powell  Procedure(s) Performed: TRANSESOPHAGEAL ECHOCARDIOGRAM     Patient location during evaluation: PACU Anesthesia Type: MAC Level of consciousness: awake and alert Pain management: pain level controlled Vital Signs Assessment: post-procedure vital signs reviewed and stable Respiratory status: spontaneous breathing, nonlabored ventilation, respiratory function stable and patient connected to nasal cannula oxygen Cardiovascular status: blood pressure returned to baseline and stable Postop Assessment: no apparent nausea or vomiting Anesthetic complications: no   There were no known notable events for this encounter.  Last Vitals:  Vitals:   01/06/24 0911 01/06/24 0921  BP: (!) 142/81 (!) 140/92  Pulse: 86 83  Resp: 12 11  Temp:    SpO2: 98% 98%    Last Pain:  Vitals:   01/06/24 0921  TempSrc:   PainSc: 0-No pain                 Debby FORBES Like

## 2024-01-06 NOTE — Transfer of Care (Signed)
 Immediate Anesthesia Transfer of Care Note  Patient: Taylor Powell  Procedure(s) Performed: TRANSESOPHAGEAL ECHOCARDIOGRAM  Patient Location: PACU and Cath Lab  Anesthesia Type:MAC  Level of Consciousness: awake, drowsy, patient cooperative, and responds to stimulation  Airway & Oxygen Therapy: Patient Spontanous Breathing and Patient connected to nasal cannula oxygen  Post-op Assessment: Report given to RN and Post -op Vital signs reviewed and stable  Post vital signs: Reviewed and stable  Last Vitals:  Vitals Value Taken Time  BP    Temp    Pulse    Resp    SpO2      Last Pain:  Vitals:   01/06/24 0736  TempSrc: Temporal         Complications: There were no known notable events for this encounter.

## 2024-01-07 ENCOUNTER — Encounter (HOSPITAL_COMMUNITY): Payer: Self-pay | Admitting: Cardiology

## 2024-01-09 ENCOUNTER — Ambulatory Visit: Payer: Self-pay | Admitting: Cardiology

## 2024-01-11 ENCOUNTER — Telehealth: Payer: Self-pay

## 2024-01-11 ENCOUNTER — Other Ambulatory Visit (HOSPITAL_COMMUNITY): Payer: Self-pay

## 2024-01-11 NOTE — Telephone Encounter (Signed)
 I have called [employer](pts plan) and was informed that P/A Must be submitted by Fax. I have received the FAXED P/A form and completed it for Rx Ozempic  (2 MG/DOSE) 8MG /3ML pen-injectors.  Currently awaiting determination

## 2024-01-11 NOTE — Telephone Encounter (Signed)
 Update as of 11/26   I have called [employer] and was informed that P/A Must be submitted by Fax, so you will have to call in and request the P/A form to be sent to you as ''[employer] PLAN '' doesn't go through [facility]. Currently awaiting form. Please follow new Encounter

## 2024-01-16 ENCOUNTER — Ambulatory Visit: Admitting: Student in an Organized Health Care Education/Training Program

## 2024-01-26 ENCOUNTER — Other Ambulatory Visit (HOSPITAL_COMMUNITY): Payer: Self-pay

## 2024-01-30 ENCOUNTER — Other Ambulatory Visit (HOSPITAL_COMMUNITY): Payer: Self-pay

## 2024-01-30 ENCOUNTER — Encounter: Payer: Self-pay | Admitting: Family Medicine

## 2024-01-31 ENCOUNTER — Other Ambulatory Visit: Payer: Self-pay | Admitting: *Deleted

## 2024-01-31 DIAGNOSIS — E119 Type 2 diabetes mellitus without complications: Secondary | ICD-10-CM

## 2024-01-31 MED ORDER — SEMAGLUTIDE (2 MG/DOSE) 8 MG/3ML ~~LOC~~ SOPN: 2.0000 mg | PEN_INJECTOR | SUBCUTANEOUS | 2 refills | Status: AC

## 2024-01-31 NOTE — Telephone Encounter (Signed)
 I have spoke to the supervisor with the pts plan and was informed their faxes are backedup. However She has placed an emergency override for the pts RX so he can pick that up by thurday by midnight at the latest. However the msg from the pt states he needs ''refills'' as well.  - I have spoke with supervisor amanda at [phone]

## 2024-01-31 NOTE — Telephone Encounter (Signed)
(  This msg is based off of the patients msg yesterday) please see that msg in the pts chart

## 2024-01-31 NOTE — Telephone Encounter (Signed)
 The patient called in to make sure the prior authorization was done and latest labs were sent to his insurance company. I see Taylor Powell has been working on that. He also states that the way the prescription was written looks like a #90 day or 3 month supply and it should be written as a month supply with refills. I spoke with Veronica about that and she is resending the script over to the pharmacy. I informed the patient of all of this and he was appreciative. Please assist patient further.

## 2024-01-31 NOTE — Telephone Encounter (Signed)
 I sent rx to pharmacy for patient.

## 2024-02-02 ENCOUNTER — Other Ambulatory Visit (HOSPITAL_COMMUNITY): Payer: Self-pay

## 2024-02-02 NOTE — Telephone Encounter (Signed)
 Pharmacy Patient Advocate Encounter  Received notification from [facility] that Prior Authorization for Ozempic  (2 MG/DOSE) 8MG /3ML pen-injectors  has been APPROVED from 01/31/24 to 01/30/25   PA #/Case ID/Reference #: [id number]

## 2024-11-27 ENCOUNTER — Encounter: Payer: Self-pay | Admitting: Family Medicine
